# Patient Record
Sex: Male | Born: 1955 | Race: White | Hispanic: No | Marital: Married | Smoking: Never smoker
Health system: Southern US, Community
[De-identification: ages and names within clinical notes are randomized; demographics above are authoritative.]

## PROBLEM LIST (undated history)

## (undated) DIAGNOSIS — I10 Essential (primary) hypertension: Secondary | ICD-10-CM

## (undated) DIAGNOSIS — J309 Allergic rhinitis, unspecified: Secondary | ICD-10-CM

## (undated) DIAGNOSIS — E039 Hypothyroidism, unspecified: Secondary | ICD-10-CM

## (undated) DIAGNOSIS — G4730 Sleep apnea, unspecified: Secondary | ICD-10-CM

## (undated) DIAGNOSIS — K219 Gastro-esophageal reflux disease without esophagitis: Secondary | ICD-10-CM

## (undated) DIAGNOSIS — M353 Polymyalgia rheumatica: Secondary | ICD-10-CM

## (undated) DIAGNOSIS — M1990 Unspecified osteoarthritis, unspecified site: Secondary | ICD-10-CM

## (undated) DIAGNOSIS — K4090 Unilateral inguinal hernia, without obstruction or gangrene, not specified as recurrent: Secondary | ICD-10-CM

## (undated) HISTORY — PX: BACK SURGERY: SHX140

## (undated) HISTORY — DX: Essential (primary) hypertension: I10

## (undated) HISTORY — PX: SPINE SURGERY: SHX786

## (undated) HISTORY — DX: Allergic rhinitis, unspecified: J30.9

## (undated) HISTORY — DX: Unilateral inguinal hernia, without obstruction or gangrene, not specified as recurrent: K40.90

## (undated) HISTORY — PX: HERNIA REPAIR: SHX51

## (undated) HISTORY — DX: Gastro-esophageal reflux disease without esophagitis: K21.9

## (undated) HISTORY — DX: Sleep apnea, unspecified: G47.30

## (undated) HISTORY — DX: Hypothyroidism, unspecified: E03.9

## (undated) HISTORY — DX: Unspecified osteoarthritis, unspecified site: M19.90

## (undated) HISTORY — DX: Polymyalgia rheumatica: M35.3

---

## 2009-01-21 HISTORY — PX: HERNIA REPAIR: SHX51

## 2009-07-21 DIAGNOSIS — K4090 Unilateral inguinal hernia, without obstruction or gangrene, not specified as recurrent: Secondary | ICD-10-CM

## 2009-07-21 HISTORY — DX: Unilateral inguinal hernia, without obstruction or gangrene, not specified as recurrent: K40.90

## 2014-02-07 ENCOUNTER — Ambulatory Visit: Payer: Self-pay | Admitting: Family Medicine

## 2016-02-16 ENCOUNTER — Emergency Department
Admission: EM | Admit: 2016-02-16 | Discharge: 2016-02-16 | Disposition: A | Discharge status: Home / Self Care | Payer: Managed Care, Other (non HMO) | Attending: Emergency Medicine | Admitting: Emergency Medicine

## 2016-02-16 ENCOUNTER — Emergency Department: Payer: Managed Care, Other (non HMO)

## 2016-02-16 ENCOUNTER — Encounter: Payer: Self-pay | Admitting: Emergency Medicine

## 2016-02-16 DIAGNOSIS — M5416 Radiculopathy, lumbar region: Secondary | ICD-10-CM | POA: Diagnosis not present

## 2016-02-16 DIAGNOSIS — M545 Low back pain, unspecified: Secondary | ICD-10-CM

## 2016-02-16 MED ORDER — HYDROCODONE-ACETAMINOPHEN 5-325 MG PO TABS: 1.0000 | ORAL_TABLET | Freq: Three times a day (TID) | ORAL | 0 refills | Status: DC | PRN

## 2016-02-16 MED ORDER — ORPHENADRINE CITRATE 30 MG/ML IJ SOLN
60.0000 mg | INTRAMUSCULAR | Status: AC
  Administered 2016-02-16: 60 mg via INTRAMUSCULAR
  Filled 2016-02-16: qty 2

## 2016-02-16 MED ORDER — KETOROLAC TROMETHAMINE 10 MG PO TABS: 10.0000 mg | ORAL_TABLET | Freq: Three times a day (TID) | ORAL | 0 refills | Status: DC

## 2016-02-16 MED ORDER — PREDNISONE 20 MG PO TABS: 20.0000 mg | ORAL_TABLET | Freq: Two times a day (BID) | ORAL | 0 refills | Status: DC

## 2016-02-16 MED ORDER — PREDNISONE 20 MG PO TABS
60.0000 mg | ORAL_TABLET | Freq: Once | ORAL | Status: AC
  Administered 2016-02-16: 60 mg via ORAL
  Filled 2016-02-16: qty 3

## 2016-02-16 MED ORDER — DIAZEPAM 2 MG PO TABS: 2.0000 mg | ORAL_TABLET | Freq: Three times a day (TID) | ORAL | 0 refills | Status: DC | PRN

## 2016-02-16 MED ORDER — HYDROMORPHONE HCL 1 MG/ML IJ SOLN
1.0000 mg | Freq: Once | INTRAMUSCULAR | Status: AC
  Administered 2016-02-16: 1 mg via INTRAMUSCULAR
  Filled 2016-02-16: qty 1

## 2016-02-16 MED ORDER — KETOROLAC TROMETHAMINE 60 MG/2ML IM SOLN
60.0000 mg | Freq: Once | INTRAMUSCULAR | Status: AC
  Administered 2016-02-16: 60 mg via INTRAMUSCULAR
  Filled 2016-02-16: qty 2

## 2016-02-16 MED ORDER — CYCLOBENZAPRINE HCL 5 MG PO TABS: 5.0000 mg | ORAL_TABLET | Freq: Three times a day (TID) | ORAL | 0 refills | Status: DC | PRN

## 2016-02-16 NOTE — ED Notes (Signed)
Patient c/o lower right back pain, radiating down right leg X 2 weeks with increasing severity last night. Pt reports hx of lumbar back surgery in 1988. Patient reports he was shoveling snow on 1/17 (same day numbness/tingling down right leg began)

## 2016-02-16 NOTE — ED Notes (Signed)
Patient reports 800 mg Metaxalone at [facility]

## 2016-02-16 NOTE — ED Triage Notes (Addendum)
C/O pain to right hip, right low back, and numbness to right thigh.  Pain began after shoveling snow last week and worsened last night at work.

## 2016-02-16 NOTE — Discharge Instructions (Addendum)
Your exam and x-ray are consistent with a lumbar radiculopathy, or spinal nerve irritation. Take the prescription meds as directed. Take the Ketorolac, Valium, and Norco as directed. Start the steroid, after the Ketorolac is completed, if symptoms persist. Apply ice packs to reduce symptoms. Follow-up with Dr. Roland Rack and be sure to establish care with a local primary care provider. Return to the ED for increased/uncontrolled pain, leg weakness, or incontinence.

## 2016-02-16 NOTE — ED Provider Notes (Signed)
[facility] Emergency Department Provider Note ____________________________________________  Time seen: 7  I have reviewed the triage vital signs and the nursing notes.  HISTORY  Chief Complaint  Back Pain  HPI Nicholas Wall is a 61 y.o. male resents to the ED, accompanied by his wife for evaluation of pain to the right side low back, right hip and numbness to the anterior right thigh. Mode history of a right L4 laminectomy some 20 years ago. He denies any history of chronic ongoing persistent back pain. He does admit to recognizing from anterior leg paresthesias 2 weeks ago. He describes symptoms are probably worsened by his snow shoveling activities over the last 2 weeks. 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 He denies any injury, accident, fall. He is without any distal paresthesias, foot drop, or incontinence. Patient has taken ibuprofen and his wife's Skelaxin without significant benefit.  History reviewed. No pertinent past medical history.  There are no active problems to display for this patient.   Past Surgical History:  Procedure Laterality Date  . [place]-- L4-L5 laminectomy  . HERNIA REPAIR      Prior to Admission medications   Medication Sig Start Date End Date Taking? Authorizing Provider  cyclobenzaprine (FLEXERIL) 5 MG tablet Take 1 tablet (5 mg total) by mouth 3 (three) times daily as needed for muscle spasms. 02/16/16   Verdell Kincannon V Bacon Shabana Armentrout, PA-C  ketorolac (TORADOL) 10 MG tablet Take 1 tablet (10 mg total) by mouth every 8 (eight) hours. 02/16/16   Dannielle Karvonen Teran Knittle, PA-C   Allergies Patient has no known allergies.  No family history on file.  Social History Social History  Substance Use Topics  . Smoking status: Never Smoker  . Smokeless tobacco: Never Used  . Alcohol use Yes     Comment: daily    Review of Systems  Constitutional: Negative for fever. Cardiovascular:  Negative for chest pain. Respiratory: Negative for shortness of breath. Gastrointestinal: Negative for abdominal pain, vomiting and diarrhea. Genitourinary: Negative for dysuria. Musculoskeletal: Positive for back pain. Skin: Negative for rash. Neurological: Negative for headaches, focal weakness. Reports right anterior thigh numbness. ____________________________________________  PHYSICAL EXAM:  VITAL SIGNS: ED Triage Vitals  Enc Vitals Group     BP 02/16/16 1834 (!) 162/93     Pulse Rate 02/16/16 1834 65     Resp 02/16/16 1834 16     Temp 02/16/16 1834 98.1 F (36.7 C)     Temp src --      SpO2 02/16/16 1834 98 %     Weight 02/16/16 1832 258 lb (117 kg)     Height 02/16/16 1832 6\' 2"  (1.88 m)     Head Circumference --      Peak Flow --      Pain Score 02/16/16 1832 10     Pain Loc --      Pain Edu? --      Excl. in [place]? --     Constitutional: Alert and oriented. Well appearing and in no distress. Head: Normocephalic and atraumatic. Cardiovascular: Normal rate, regular rhythm. Normal distal pulses. Respiratory: Normal respiratory effort. No wheezes/rales/rhonchi. Gastrointestinal: Soft and nontender. No distention. Musculoskeletal: Normal spinal alignment without midline tenderness, spasm, deformity, or step-off. Patient is tender to palpation over the right SI joint region. He is able to transition from sit to stand but is unable to fully extend the lumbar spine. Able to dorsiflex the toes and  fever to the feet. Nontender with normal range of motion in all extremities.  Neurologic:  Antalgic for flexed gait without ataxia. Nerves II through XII grossly intact. Normal LE DTRs bilaterally. Normal speech and language. No gross focal neurologic deficits are appreciated. Skin:  Skin is warm, dry and intact. No rash noted. Psychiatric: Mood and affect are normal. Patient exhibits appropriate insight and judgment. ____________________________________________   RADIOLOGY  Lumbar  Spine   IMPRESSION: No acute findings.  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  I, Josephene Marrone, Dannielle Karvonen, personally viewed and evaluated these images (plain radiographs) as part of my medical decision making, as well as reviewing the written report by the radiologist. ____________________________________________  PROCEDURES  Toradol 60 mg IM Norflex 60 mg IM Dilaudid 1 mg IM Prednisone 60 mg PO ____________________________________________  INITIAL IMPRESSION / ASSESSMENT AND PLAN / ED COURSE  Patioent with a presentation consistent with lumbar radicular symptoms in the L3-4 distribution on the right. Reports pain improved to a 3/10 at the time of this disposition. He is reassured by his stable x-rays. He will follow-up with his provider or spine specialist as needed. Prescriptions for Prednisone, Valium, Toradol, Norco, and Flexeril are provided. Dosing instructions are provided both verbally, and written. Return precautions are reviewed.  ____________________________________________  FINAL CLINICAL IMPRESSION(S) / ED DIAGNOSES  Final diagnoses:  Acute right-sided low back pain without sciatica  Lumbar radiculopathy      Melvenia Needles, PA-C 02/16/16 2257    Melvenia Needles, PA-C 02/16/16 2258    Lavonia Drafts, MD 02/16/16 2311

## 2016-02-19 ENCOUNTER — Emergency Department
Admission: EM | Admit: 2016-02-19 | Discharge: 2016-02-19 | Disposition: A | Discharge status: Home / Self Care | Payer: Managed Care, Other (non HMO) | Attending: Emergency Medicine | Admitting: Emergency Medicine

## 2016-02-19 ENCOUNTER — Encounter: Payer: Self-pay | Admitting: Emergency Medicine

## 2016-02-19 DIAGNOSIS — M545 Low back pain: Secondary | ICD-10-CM | POA: Diagnosis present

## 2016-02-19 DIAGNOSIS — M5441 Lumbago with sciatica, right side: Secondary | ICD-10-CM | POA: Diagnosis not present

## 2016-02-19 MED ORDER — PREDNISONE 10 MG (21) PO TBPK: ORAL_TABLET | ORAL | 0 refills | Status: DC

## 2016-02-19 MED ORDER — OXYCODONE-ACETAMINOPHEN 5-325 MG PO TABS: 1.0000 | ORAL_TABLET | ORAL | 0 refills | Status: DC | PRN

## 2016-02-19 MED ORDER — DIAZEPAM 2 MG PO TABS: 2.0000 mg | ORAL_TABLET | Freq: Three times a day (TID) | ORAL | 0 refills | Status: DC | PRN

## 2016-02-19 MED ORDER — CYCLOBENZAPRINE HCL 5 MG PO TABS: ORAL_TABLET | ORAL | 0 refills | Status: DC

## 2016-02-19 MED ORDER — CYCLOBENZAPRINE HCL 5 MG PO TABS: 5.0000 mg | ORAL_TABLET | Freq: Three times a day (TID) | ORAL | 0 refills | Status: DC | PRN

## 2016-02-19 NOTE — Discharge Instructions (Signed)
Call and make an appointment with Dr. Sabra Wall for evaluation of Nicholas Wall back. Discontinue Norco (hydrocodone) for pain. Discontinue Toradol. Discontinue taking Flexeril (cyclobenzaprine) 3 times a day. Begin taking Flexeril 2 tablets at bedtime.  This may cause increased drowsiness so be aware of the increased risks of following if you get up during the night. Begin taking Sterapred DS pack as directed. Hold on to your prednisone 20 mg tablets until after he had seen Dr. Sabra Wall. Percocet 1 every 4 hours as needed for pain. Continue taking diazepam 2 mg 1 every 8 hours during the day but not to be taken with Flexeril. Keep your Appointment with Nicholas Wall in [place] and have Nicholas Wall blood pressure reevaluated. Ice or heat to your back as needed for comfort. Return to the emergency room immediately if any loss of bowel or bladder control.

## 2016-02-19 NOTE — ED Triage Notes (Addendum)
Was seen last week for lower back pain after shoveling snow  States meds are not any better  Ambulates with slight limp d/t pain  Has not started on prednisone yet

## 2016-02-19 NOTE — ED Provider Notes (Signed)
[facility] Emergency Department Provider Note   ____________________________________________   First MD Initiated Contact with Patient 02/19/16 1440     (approximate)  I have reviewed the triage vital signs and the nursing notes.   HISTORY  Chief Complaint Back Pain    HPI Nicholas Wall is a 61 y.o. male is here with continued low back pain. Patient was seen in the emergency room last week with low back pain after shoveling snow. He states that even with the medication he is not any better. He is also confused at the instructions for his medication and is unclear how he should be taking them. He continues to have low back pain with radiation into his right leg. At this time he has not started taking the prednisone because of the confusion on his instructions. Patient denies any urinary symptoms. He also denies any incontinence of bowel or bladder. Patient has the bottles of the prescriptions that he was prescribed on his last visit. His current bottle was renewed Flexeril 5 mg 3 times a day, diazepam 2 mg 3 times a day, Norco one tablet every 8 hours for pain, Toradol 10 mg 3 times a day. Patient has prednisone 20 mg one twice a day for 5 days but has not started it at this time. Currently patient rates his pain as a 7/10. Patient was able to drive to the emergency room and was ambulatory without assistance. He also states that in the past he has been on blood pressure medication but his PCP prior to moving to [place] discontinued his medication as he lost weight. Patient also had surgery on his back in the past prior to moving to this state. Patient also gives a history of work table being much lower for other coworkers to be able to work with. He spends much of his time bending over this table. This is increasing his back pain. Patient states that until shoveling snow he did not have everyday chronic back pain.   History reviewed. No pertinent past medical  history.  There are no active problems to display for this patient.   Past Surgical History:  Procedure Laterality Date  . [place]-- L4-L5 laminectomy  . HERNIA REPAIR      Prior to Admission medications   Medication Sig Start Date End Date Taking? Authorizing Provider  cyclobenzaprine (FLEXERIL) 5 MG tablet Take 2 tablets at hs 02/19/16   Johnn Hai, PA-C  diazepam (VALIUM) 2 MG tablet Take 1 tablet (2 mg total) by mouth every 8 (eight) hours as needed for muscle spasms. 02/19/16   Johnn Hai, PA-C  oxyCODONE-acetaminophen (PERCOCET) 5-325 MG tablet Take 1 tablet by mouth every 4 (four) hours as needed for moderate pain or severe pain. 02/19/16   Johnn Hai, PA-C  predniSONE (STERAPRED UNI-PAK 21 TAB) 10 MG (21) TBPK tablet As directed 02/19/16   Johnn Hai, PA-C    Allergies Patient has no known allergies.  No family history on file.  Social History Social History  Substance Use Topics  . Smoking status: Never Smoker  . Smokeless tobacco: Never Used  . Alcohol use Yes     Comment: daily    Review of Systems Constitutional: No fever/chills Cardiovascular: Denies chest pain. Respiratory: Denies shortness of breath. Gastrointestinal: No abdominal pain.  No nausea, no vomiting.  Genitourinary: Negative for dysuria. Musculoskeletal: Positive for low back pain. Positive for right leg sciatica. Skin: Negative for rash.  Neurological: Negative for headaches, focal weakness. Positive right leg paresthesia.  10-point ROS otherwise negative.  ____________________________________________   PHYSICAL EXAM:  VITAL SIGNS: ED Triage Vitals  Enc Vitals Group     BP 02/19/16 1434 (!) 168/101     Pulse Rate 02/19/16 1434 70     Resp 02/19/16 1434 18     Temp 02/19/16 1434 98.1 F (36.7 C)     Temp Source 02/19/16 1434 Oral     SpO2 02/19/16 1434 98 %     Weight 02/19/16 1434 258 lb (117 kg)     Height 02/19/16 1434 6\' 2"  (1.88 m)     Head  Circumference --      Peak Flow --      Pain Score 02/19/16 1438 7     Pain Loc --      Pain Edu? --      Excl. in [place]? --     Constitutional: Alert and oriented. Well appearing and in no acute distress. Eyes: Conjunctivae are normal. PERRL. EOMI. Head: Atraumatic. Nose: No congestion/rhinnorhea. Neck: No stridor.   Cardiovascular: Normal rate, regular rhythm. Grossly normal heart sounds.  Good peripheral circulation. Respiratory: Normal respiratory effort.  No retractions. Lungs CTAB. Musculoskeletal: On examination of low back there is a well-healed surgical scar at the waistline. There is tenderness to palpation in this area L5-S1 as well as right paravertebral muscles. Range of motion is guarded secondary to pain. Reflexes were 1+ bilaterally. Straight leg raises were approximately 30 bilateral legs with increased pain in his back. Neurologic:  Normal speech and language. No gross focal neurologic deficits are appreciated.  Skin:  Skin is warm, dry and intact. No rash noted. Psychiatric: Mood and affect are normal. Speech and behavior are normal.  ____________________________________________   LABS (all labs ordered are listed, but only abnormal results are displayed)  Labs Reviewed - No data to display  RADIOLOGY  Lumbar spine x-ray on 02/16/16 was reviewed. ____________________________________________   PROCEDURES  Procedure(s) performed: None  Procedures  Critical Care performed: No  ____________________________________________   INITIAL IMPRESSION / ASSESSMENT AND PLAN / ED COURSE  Pertinent labs & imaging results that were available during my care of the patient were reviewed by me and considered in my medical decision making (see chart for details).  Blood pressure was elevated at the time of patient's visit. It is unclear whether his blood pressure is elevated because the pain or for hypertension. He has made an appointment with Dr. Janae Bridgeman and will have  this reevaluated at that time. Patient is to call tomorrow for follow-up visit with Dr. Sabra Heck. At this time we are changing his medication therapy. He will no longer take Flexeril 3 times a day and diazepam 2 mg 3 times a day. He will also discontinue taking Norco for pain and Toradol. Patient's instructions were to begin Flexeril 2 tablets at bedtime. Diazepam 2 mg 1 tablet 3 times a day. He is to discontinue taking Norco and begin taking Percocet 1 every 4 hours as needed for moderate to severe pain. He is to begin taking Sterapred DS 60 mg 6 day taper. Patient was taken out of work for the next 2 days and then to return with light duty of lifting no more than 25 pounds. He is instructed and understands to return to the emergency room immediately if any incontinence of bowel or bladder.      ____________________________________________   FINAL CLINICAL IMPRESSION(S) / ED DIAGNOSES  Final diagnoses:  Acute right-sided low back pain with right-sided sciatica      NEW MEDICATIONS STARTED DURING THIS VISIT:  Discharge Medication List as of 02/19/2016  4:04 PM       Note:  This document was prepared using Dragon voice recognition software and may include unintentional dictation errors.    Johnn Hai, PA-C 02/19/16 1633    Lavonia Drafts, MD 02/20/16 [phone]

## 2016-03-08 ENCOUNTER — Ambulatory Visit (INDEPENDENT_AMBULATORY_CARE_PROVIDER_SITE_OTHER): Payer: Managed Care, Other (non HMO) | Admitting: Family Medicine

## 2016-03-08 ENCOUNTER — Encounter: Payer: Self-pay | Admitting: Family Medicine

## 2016-03-08 VITALS — BP 128/89 | HR 97 | Temp 97.7°F | Resp 17 | Ht 73.0 in | Wt 262.0 lb

## 2016-03-08 DIAGNOSIS — M47816 Spondylosis without myelopathy or radiculopathy, lumbar region: Secondary | ICD-10-CM | POA: Insufficient documentation

## 2016-03-08 DIAGNOSIS — M4726 Other spondylosis with radiculopathy, lumbar region: Secondary | ICD-10-CM | POA: Diagnosis not present

## 2016-03-08 DIAGNOSIS — R1310 Dysphagia, unspecified: Secondary | ICD-10-CM

## 2016-03-08 DIAGNOSIS — Z125 Encounter for screening for malignant neoplasm of prostate: Secondary | ICD-10-CM

## 2016-03-08 DIAGNOSIS — M5441 Lumbago with sciatica, right side: Secondary | ICD-10-CM

## 2016-03-08 DIAGNOSIS — Z1211 Encounter for screening for malignant neoplasm of colon: Secondary | ICD-10-CM

## 2016-03-08 DIAGNOSIS — K219 Gastro-esophageal reflux disease without esophagitis: Secondary | ICD-10-CM

## 2016-03-08 DIAGNOSIS — Z23 Encounter for immunization: Secondary | ICD-10-CM

## 2016-03-08 DIAGNOSIS — Z8639 Personal history of other endocrine, nutritional and metabolic disease: Secondary | ICD-10-CM

## 2016-03-08 DIAGNOSIS — Z1159 Encounter for screening for other viral diseases: Secondary | ICD-10-CM

## 2016-03-08 DIAGNOSIS — Z13220 Encounter for screening for lipoid disorders: Secondary | ICD-10-CM

## 2016-03-08 DIAGNOSIS — E039 Hypothyroidism, unspecified: Secondary | ICD-10-CM | POA: Insufficient documentation

## 2016-03-08 LAB — UA/M W/RFLX CULTURE, ROUTINE
Bilirubin, UA: NEGATIVE
Glucose, UA: NEGATIVE
Ketones, UA: NEGATIVE
Leukocytes, UA: NEGATIVE
Nitrite, UA: NEGATIVE
PH UA: 5.5 (ref 5.0–7.5)
RBC, UA: NEGATIVE
Specific Gravity, UA: >1.03 — ABNORMAL HIGH (ref 1.005–1.030)
UUROB: 0.2 mg/dL (ref 0.2–1.0)

## 2016-03-08 LAB — MICROSCOPIC EXAMINATION
Epithelial Cells (non renal): NONE SEEN /hpf (ref 0–10)
RBC MICROSCOPIC, UA: NONE SEEN /HPF (ref 0–?)

## 2016-03-08 MED ORDER — OMEPRAZOLE 20 MG PO CPDR: 20.0000 mg | DELAYED_RELEASE_CAPSULE | Freq: Every day | ORAL | 1 refills | Status: DC

## 2016-03-08 MED ORDER — IBUPROFEN 800 MG PO TABS
800.0000 mg | ORAL_TABLET | Freq: Three times a day (TID) | ORAL | 1 refills | Status: DC | PRN
Start: 2016-03-08 — End: 2020-11-24

## 2016-03-08 NOTE — Assessment & Plan Note (Signed)
Will restart his omeprazole, does not seem to be well controlled. To see GI for dysphagia and screening colonoscopy

## 2016-03-08 NOTE — Patient Instructions (Addendum)
Sciatica Rehab Ask your health care provider which exercises are safe for you. Do exercises exactly as told by your health care provider and adjust them as directed. It is normal to feel mild stretching, pulling, tightness, or discomfort as you do these exercises, but you should stop right away if you feel sudden pain or your pain gets worse.Do not begin these exercises until told by your health care provider. Stretching and range of motion exercises These exercises warm up your muscles and joints and improve the movement and flexibility of your hips and your back. These exercises also help to relieve pain, numbness, and tingling. Exercise A: Sciatic nerve glide 1. Sit in a chair with your head facing down toward your chest. Place your hands behind your back. Let your shoulders slump forward. 2. Slowly straighten one of your knees while you tilt your head back as if you are looking toward the ceiling. Only straighten your leg as far as you can without making your symptoms worse. 3. Hold for __________ seconds. 4. Slowly return to the starting position. 5. Repeat with your other leg. Repeat __________ times. Complete this exercise __________ times a day. Exercise B: Knee to chest with hip adduction and internal rotation 1. Lie on your back on a firm surface with both legs straight. 2. Bend one of your knees and move it up toward your chest until you feel a gentle stretch in your lower back and buttock. Then, move your knee toward the shoulder that is on the opposite side from your leg.  Hold your leg in this position by holding onto the front of your knee. 3. Hold for __________ seconds. 4. Slowly return to the starting position. 5. Repeat with your other leg. Repeat __________ times. Complete this exercise __________ times a day. Exercise C: Prone extension on elbows 1. Lie on your abdomen on a firm surface. A bed may be too soft for this exercise. 2. Prop yourself up on your elbows. 3. Use  your arms to help lift your chest up until you feel a gentle stretch in your abdomen and your lower back.  This will place some of your body weight on your elbows. If this is uncomfortable, try stacking pillows under your chest.  Your hips should stay down, against the surface that you are lying on. Keep your hip and back muscles relaxed. 4. Hold for __________ seconds. 5. Slowly relax your upper body and return to the starting position. Repeat __________ times. Complete this exercise __________ times a day. Strengthening exercises These exercises build strength and endurance in your back. Endurance is the ability to use your muscles for a long time, even after they get tired. Exercise D: Pelvic tilt 1. Lie on your back on a firm surface. Bend your knees and keep your feet flat. 2. Tense your abdominal muscles. Tip your pelvis up toward the ceiling and flatten your lower back into the floor.  To help with this exercise, you may place a small towel under your lower back and try to push your back into the towel. 3. Hold for __________ seconds. 4. Let your muscles relax completely before you repeat this exercise. Repeat __________ times. Complete this exercise __________ times a day. Exercise E: Alternating arm and leg raises 1. Get on your hands and knees on a firm surface. If you are on a hard floor, you may want to use padding to cushion your knees, such as an exercise mat. 2. Line up your arms and legs. Your  hands should be below your shoulders, and your knees should be below your hips. 3. Lift your left leg behind you. At the same time, raise your right arm and straighten it in front of you.  Do not lift your leg higher than your hip.  Do not lift your arm higher than your shoulder.  Keep your abdominal and back muscles tight.  Keep your hips facing the ground.  Do not arch your back.  Keep your balance carefully, and do not hold your breath. 4. Hold for __________  seconds. 5. Slowly return to the starting position and repeat with your right leg and your left arm. Repeat __________ times. Complete this exercise __________ times a day. Posture and body mechanics   Body mechanics refers to the movements and positions of your body while you do your daily activities. Posture is part of body mechanics. Good posture and healthy body mechanics can help to relieve stress in your body's tissues and joints. Good posture means that your spine is in its natural S-curve position (your spine is neutral), your shoulders are pulled back slightly, and your head is not tipped forward. The following are general guidelines for applying improved posture and body mechanics to your everyday activities. Standing   When standing, keep your spine neutral and your feet about hip-width apart. Keep a slight bend in your knees. Your ears, shoulders, and hips should line up.  When you do a task in which you stand in one place for a long time, place one foot up on a stable object that is 2-4 inches (5-10 cm) high, such as a footstool. This helps keep your spine neutral. Sitting  When sitting, keep your spine neutral and keep your feet flat on the floor. Use a footrest, if necessary, and keep your thighs parallel to the floor. Avoid rounding your shoulders, and avoid tilting your head forward.  When working at a desk or a computer, keep your desk at a height where your hands are slightly lower than your elbows. Slide your chair under your desk so you are close enough to maintain good posture.  When working at a computer, place your monitor at a height where you are looking straight ahead and you do not have to tilt your head forward or downward to look at the screen. Resting   When lying down and resting, avoid positions that are most painful for you.  If you have pain with activities such as sitting, bending, stooping, or squatting (flexion-based activities), lie in a position in  which your body does not bend very much. For example, avoid curling up on your side with your arms and knees near your chest (fetal position).  If you have pain with activities such as standing for a long time or reaching with your arms (extension-based activities), lie with your spine in a neutral position and bend your knees slightly. Try the following positions:  Lying on your side with a pillow between your knees.  Lying on your back with a pillow under your knees. Lifting   When lifting objects, keep your feet at least shoulder-width apart and tighten your abdominal muscles.  Bend your knees and hips and keep your spine neutral. It is important to lift using the strength of your legs, not your back. Do not lock your knees straight out.  Always ask for help to lift heavy or awkward objects. This information is not intended to replace advice given to you by your health care provider. Make sure  or awkward objects. This information is not intended to replace advice given to you by your health care provider. Make sure you discuss any questions you have with your health care provider. Document Released: 01/07/2005 Document Revised: 09/14/2015 Document Reviewed: 09/23/2014 [employer] Interactive Patient Education  2017 [employer]          

## 2016-03-08 NOTE — Progress Notes (Signed)
BP 128/89 (BP Location: Left Arm, Patient Position: Sitting, Cuff Size: Large)   Pulse 97   Temp 97.7 F (36.5 C) (Oral)   Resp 17   Ht 6\' 1"  (1.854 m)   Wt 262 lb (118.8 kg)   SpO2 97%   BMI 34.57 kg/m    Subjective:    Patient ID: Nicholas Wall, male    DOB: 02-Feb-1955, 61 y.o.   MRN: [id number]  HPI: Nicholas Wall is a 61 y.o. male  Chief Complaint  Patient presents with  . Establish Care   Hasn't seen a doctor in 7-8 years. Has a history of hypothyroid, not on any medicine.   BACK PAIN Duration: couple of months Mechanism of injury: no trauma, worsened by shoveling the snow Location: Right and low back and into his R hip Onset: gradual Severity: 2/10 Quality: aching and burning Frequency: intermittent Radiation: R leg above the knee Aggravating factors: bending over Alleviating factors: leaning and flexing to the R, ibuprofen, flexeril Status: better Treatments attempted: flexeril, chiropractry, and ibuprofen  Relief with NSAIDs?: significant Nighttime pain:  no Paresthesias / decreased sensation:  yes Bowel / bladder incontinence:  no Fevers:  no Dysuria / urinary frequency:  no  DYSPHAGIA Duration: chronic Description of symptom: last bite of food gets stuck Onset: Several seconds after swallowing Location of dysphagia: throat Dysphagia to solids only: yes Dysphagia to solids & liquids: no  Frequency:intermittent  Progressively getting worse: yes Alleviatiating factors: drinking something Provoking factors: certain foods Status: worse EGD: no Weight loss: no Sensation of lump in throat: no Heartburn: yes Odynophagia: no Nausea: no Vomiting: no Drooling/nasal regurgitation/food spillage: no Coughing/choking/dysphonia: no Dysarthria: no Hematemesis: no Regurgitation of undigested food/halitosis: no Chest pain: no  Active Ambulatory Problems    Diagnosis Date Noted  . History of hypothyroidism 03/08/2016  . GERD (gastroesophageal reflux  disease) 03/08/2016  . DJD (degenerative joint disease), lumbar 03/08/2016   Resolved Ambulatory Problems    Diagnosis Date Noted  . No Resolved Ambulatory Problems   No Additional Past Medical History   Past Surgical History:  Procedure Laterality Date  . [place]-- L4-L5 laminectomy  . HERNIA REPAIR     Outpatient Encounter Prescriptions as of 03/08/2016  Medication Sig  . cyclobenzaprine (FLEXERIL) 5 MG tablet Take 2 tablets at hs  . ibuprofen (ADVIL,MOTRIN) 800 MG tablet Take 1 tablet (800 mg total) by mouth every 8 (eight) hours as needed.  . [DISCONTINUED] ibuprofen (ADVIL,MOTRIN) 800 MG tablet Take 800 mg by mouth every 6 (six) hours as needed.  Marland Kitchen omeprazole (PRILOSEC) 20 MG capsule Take 1 capsule (20 mg total) by mouth daily.  . [DISCONTINUED] diazepam (VALIUM) 2 MG tablet Take 1 tablet (2 mg total) by mouth every 8 (eight) hours as needed for muscle spasms.  . [DISCONTINUED] oxyCODONE-acetaminophen (PERCOCET) 5-325 MG tablet Take 1 tablet by mouth every 4 (four) hours as needed for moderate pain or severe pain.  . [DISCONTINUED] predniSONE (STERAPRED UNI-PAK 21 TAB) 10 MG (21) TBPK tablet As directed   No facility-administered encounter medications on file as of 03/08/2016.    No Known Allergies Social History   Social History  . Marital status: Married    Spouse name: N/A  . Number of children: N/A  . Years of education: N/A   Occupational History  . Not on file.   Social History Main Topics  . Smoking status: Never Smoker  . Smokeless tobacco: Never Used  . Alcohol use Yes  Comment: daily  . Drug use: No  . Sexual activity: Not on file   Other Topics Concern  . Not on file   Social History Narrative  . No narrative on file   Family History  Problem Relation Age of Onset  . Hypertension Mother   . Hyperlipidemia Mother   . Diabetes Mother   . Arthritis Mother   . Vision loss Mother   . Cancer Father   . Hyperlipidemia Father   .  Arthritis Father   . Heart disease Father   . Hyperlipidemia Maternal Grandmother   . Hypertension Maternal Grandmother   . Alzheimer's disease Maternal Grandmother   . Heart disease Maternal Grandfather   . Hyperlipidemia Maternal Grandfather   . Hypertension Maternal Grandfather   . Cancer Paternal Grandmother     Review of Systems  Constitutional: Negative.   Respiratory: Negative.   Cardiovascular: Negative.   Gastrointestinal:       Heartburn  Musculoskeletal: Positive for back pain and myalgias. Negative for arthralgias, gait problem, joint swelling, neck pain and neck stiffness.  Neurological: Positive for numbness. Negative for dizziness, tremors, seizures, syncope, facial asymmetry, speech difficulty, weakness, light-headedness and headaches.  Psychiatric/Behavioral: Negative.     Per HPI unless specifically indicated above     Objective:    BP 128/89 (BP Location: Left Arm, Patient Position: Sitting, Cuff Size: Large)   Pulse 97   Temp 97.7 F (36.5 C) (Oral)   Resp 17   Ht 6\' 1"  (1.854 m)   Wt 262 lb (118.8 kg)   SpO2 97%   BMI 34.57 kg/m   Wt Readings from Last 3 Encounters:  03/08/16 262 lb (118.8 kg)  02/19/16 258 lb (117 kg)  02/16/16 258 lb (117 kg)    Physical Exam  Constitutional: He is oriented to person, place, and time. He appears well-developed and well-nourished. No distress.  HENT:  Head: Normocephalic and atraumatic.  Right Ear: Hearing normal.  Left Ear: Hearing normal.  Nose: Nose normal.  Eyes: Conjunctivae and lids are normal. Right eye exhibits no discharge. Left eye exhibits no discharge. No scleral icterus.  Cardiovascular: Normal rate, regular rhythm and intact distal pulses.  Exam reveals no gallop and no friction rub.   No murmur heard. Pulmonary/Chest: Effort normal. No respiratory distress. He has no wheezes. He has no rales. He exhibits no tenderness.  Musculoskeletal: Normal range of motion.  Neurological: He is alert and  oriented to person, place, and time.  Skin: Skin is warm, dry and intact. No rash noted. No erythema. No pallor.  Psychiatric: He has a normal mood and affect. His speech is normal and behavior is normal. Judgment and thought content normal. Cognition and memory are normal.  Nursing note and vitals reviewed. Back Exam:    Inspection:  Normal spinal curvature.  No deformity, ecchymosis, erythema, or lesions     Palpation:     Midline spinal tenderness: no      Paralumbar tenderness: yes Right     Parathoracic tenderness: no      Buttocks tenderness: yesRight     Range of Motion:      Flexion: Fingers to Knees     Extension:Decreased     Lateral bending:Decreased    Rotation:Decreased    Neuro Exam:Lower extremity DTRs normal & symmetric.  Strength and sensation intact except L2-3 on the R to sensation    Special Tests:      Straight leg raise:negative  No results found for this or  any previous visit.    Assessment & Plan:   Problem List Items Addressed This Visit      Digestive   GERD (gastroesophageal reflux disease)    Will restart his omeprazole, does not seem to be well controlled. To see GI for dysphagia and screening colonoscopy      Relevant Medications   omeprazole (PRILOSEC) 20 MG capsule   Other Relevant Orders   Ambulatory referral to Gastroenterology     Musculoskeletal and Integument   DJD (degenerative joint disease), lumbar    Offered PT, decreased sensation, but otherwise normal neuro exam. Continue to monitor closely.      Relevant Medications   ibuprofen (ADVIL,MOTRIN) 800 MG tablet     Other   History of hypothyroidism    Will check labs today and treat as needed.       Relevant Orders   Comprehensive metabolic panel   TSH    Other Visit Diagnoses    Acute right-sided low back pain with right-sided sciatica    -  Primary   Improving with chiropractry. Offered OMM and PT, he will consider. Continue current regimen. Call with any concerns.     Relevant Medications   ibuprofen (ADVIL,MOTRIN) 800 MG tablet   Other Relevant Orders   CBC with Differential/Platelet   Comprehensive metabolic panel   UA/M w/rflx Culture, Routine   Screening for cholesterol level       Labs drawn today. Await results.    Relevant Orders   Lipid Panel w/o Chol/HDL Ratio   Screening for prostate cancer       Labs drawn today. Await results.    Relevant Orders   PSA   Need for hepatitis C screening test       Labs drawn today. Await results.    Relevant Orders   Hepatitis C Antibody   Immunization due       Tdap given today.   Relevant Orders   Tdap vaccine greater than or equal to 7yo IM (Completed)   Dysphagia, unspecified type       Will get him into GI- referral generated today.   Relevant Orders   Ambulatory referral to Gastroenterology   Screening for colon cancer       Referral to GI made today.   Relevant Orders   Ambulatory referral to Gastroenterology       Follow up plan: Return Physical or OMM (not both same day), for Records Release Please.

## 2016-03-08 NOTE — Assessment & Plan Note (Signed)
Will check labs today and treat as needed.

## 2016-03-08 NOTE — Assessment & Plan Note (Signed)
Offered PT, decreased sensation, but otherwise normal neuro exam. Continue to monitor closely.

## 2016-03-09 LAB — COMPREHENSIVE METABOLIC PANEL
A/G RATIO: 1.9 (ref 1.2–2.2)
ALK PHOS: 67 IU/L (ref 39–117)
ALT: 66 IU/L — ABNORMAL HIGH (ref 0–44)
AST: 33 IU/L (ref 0–40)
Albumin: 4.3 g/dL (ref 3.5–5.5)
BUN/Creatinine Ratio: 18 (ref 9–20)
BUN: 18 mg/dL (ref 6–24)
Bilirubin Total: 0.8 mg/dL (ref 0.0–1.2)
CO2: 26 mmol/L (ref 18–29)
Calcium: 9.3 mg/dL (ref 8.7–10.2)
Chloride: 103 mmol/L (ref 96–106)
Creatinine, Ser: 1.02 mg/dL (ref 0.76–1.27)
GFR calc Af Amer: 93 mL/min/{1.73_m2} (ref >59)
GFR calc non Af Amer: 81 mL/min/{1.73_m2} (ref >59)
GLOBULIN, TOTAL: 2.3 g/dL (ref 1.5–4.5)
Glucose: 84 mg/dL (ref 65–99)
POTASSIUM: 4.6 mmol/L (ref 3.5–5.2)
Sodium: 144 mmol/L (ref 134–144)
Total Protein: 6.6 g/dL (ref 6.0–8.5)

## 2016-03-09 LAB — CBC WITH DIFFERENTIAL/PLATELET
BASOS: 0 %
Basophils Absolute: 0 10*3/uL (ref 0.0–0.2)
EOS (ABSOLUTE): 0.2 10*3/uL (ref 0.0–0.4)
EOS: 2 %
HEMATOCRIT: 41.8 % (ref 37.5–51.0)
Hemoglobin: 14.3 g/dL (ref 13.0–17.7)
Immature Grans (Abs): 0 10*3/uL (ref 0.0–0.1)
Immature Granulocytes: 0 %
LYMPHS ABS: 3.4 10*3/uL — AB (ref 0.7–3.1)
Lymphs: 34 %
MCH: 31.8 pg (ref 26.6–33.0)
MCHC: 34.2 g/dL (ref 31.5–35.7)
MCV: 93 fL (ref 79–97)
MONOS ABS: 1 10*3/uL — AB (ref 0.1–0.9)
Monocytes: 10 %
Neutrophils Absolute: 5.3 10*3/uL (ref 1.4–7.0)
Neutrophils: 54 %
Platelets: 220 10*3/uL (ref 150–379)
RBC: 4.49 x10E6/uL (ref 4.14–5.80)
RDW: 13.7 % (ref 12.3–15.4)
WBC: 10 10*3/uL (ref 3.4–10.8)

## 2016-03-09 LAB — LIPID PANEL W/O CHOL/HDL RATIO
CHOLESTEROL TOTAL: 178 mg/dL (ref 100–199)
HDL: 61 mg/dL (ref >39)
LDL Calculated: 94 mg/dL (ref 0–99)
TRIGLYCERIDES: 116 mg/dL (ref 0–149)
VLDL Cholesterol Cal: 23 mg/dL (ref 5–40)

## 2016-03-09 LAB — TSH: TSH: 8.37 u[IU]/mL — ABNORMAL HIGH (ref 0.450–4.500)

## 2016-03-09 LAB — HEPATITIS C ANTIBODY: Hep C Virus Ab: <0.1 s/co ratio (ref 0.0–0.9)

## 2016-03-09 LAB — PSA: Prostate Specific Ag, Serum: 1.2 ng/mL (ref 0.0–4.0)

## 2016-03-12 ENCOUNTER — Ambulatory Visit: Payer: Managed Care, Other (non HMO) | Admitting: Family Medicine

## 2016-03-27 ENCOUNTER — Ambulatory Visit (INDEPENDENT_AMBULATORY_CARE_PROVIDER_SITE_OTHER): Payer: Managed Care, Other (non HMO) | Admitting: Family Medicine

## 2016-03-27 ENCOUNTER — Encounter: Payer: Self-pay | Admitting: Family Medicine

## 2016-03-27 VITALS — BP 138/91 | HR 87 | Temp 98.1°F | Resp 17 | Ht 73.0 in | Wt 266.0 lb

## 2016-03-27 DIAGNOSIS — M9904 Segmental and somatic dysfunction of sacral region: Secondary | ICD-10-CM | POA: Diagnosis not present

## 2016-03-27 DIAGNOSIS — M9909 Segmental and somatic dysfunction of abdomen and other regions: Secondary | ICD-10-CM | POA: Diagnosis not present

## 2016-03-27 DIAGNOSIS — M9903 Segmental and somatic dysfunction of lumbar region: Secondary | ICD-10-CM

## 2016-03-27 DIAGNOSIS — M9902 Segmental and somatic dysfunction of thoracic region: Secondary | ICD-10-CM | POA: Diagnosis not present

## 2016-03-27 DIAGNOSIS — M5441 Lumbago with sciatica, right side: Secondary | ICD-10-CM

## 2016-03-27 DIAGNOSIS — M9908 Segmental and somatic dysfunction of rib cage: Secondary | ICD-10-CM | POA: Diagnosis not present

## 2016-03-27 DIAGNOSIS — M9905 Segmental and somatic dysfunction of pelvic region: Secondary | ICD-10-CM

## 2016-03-27 NOTE — Progress Notes (Signed)
BP (!) 138/91 (BP Location: Left Arm, Patient Position: Sitting, Cuff Size: Large)   Pulse 87   Temp 98.1 F (36.7 C) (Oral)   Resp 17   Ht 6\' 1"  (1.854 m)   Wt 266 lb (120.7 kg)   SpO2 97%   BMI 35.09 kg/m    Subjective:    Patient ID: Nicholas Wall, male    DOB: 1955/12/25, 61 y.o.   MRN: [id number]  HPI: Nicholas Wall is a 61 y.o. male  Chief Complaint  Patient presents with  . Back Pain   BACK PAIN- better with chiropractry, feeling better. Still with pain in his low back and some spasms in his mid back. Numbness and tingling has improved. He has been doing well with chiropractry, but would like to try OMT to see if it will help. Otherwise doing well with no other concerns or complaints at this time.  Duration: couple of months Mechanism of injury: no trauma, worsened by shoveling the snow Location: Right and low back and into his R hip Onset: gradual Severity: 2/10 Quality: aching and burning Frequency: intermittent Radiation: R leg above the knee Aggravating factors: bending over Alleviating factors: leaning and flexing to the R, ibuprofen, flexeril, chiropractry Status: better Treatments attempted: flexeril, chiropractry, and ibuprofen  Relief with NSAIDs?: significant Nighttime pain:  no Paresthesias / decreased sensation:  yes- improved Bowel / bladder incontinence:  no Fevers:  no Dysuria / urinary frequency:  no   Relevant past medical, surgical, family and social history reviewed and updated as indicated. Interim medical history since our last visit reviewed. Allergies and medications reviewed and updated.  Review of Systems  Constitutional: Negative.   Respiratory: Negative.   Cardiovascular: Negative.   Musculoskeletal: Positive for back pain and myalgias. Negative for arthralgias, gait problem, joint swelling, neck pain and neck stiffness.  Neurological: Positive for numbness.  Psychiatric/Behavioral: Negative.     Per HPI unless specifically  indicated above     Objective:    BP (!) 138/91 (BP Location: Left Arm, Patient Position: Sitting, Cuff Size: Large)   Pulse 87   Temp 98.1 F (36.7 C) (Oral)   Resp 17   Ht 6\' 1"  (1.854 m)   Wt 266 lb (120.7 kg)   SpO2 97%   BMI 35.09 kg/m   Wt Readings from Last 3 Encounters:  03/27/16 266 lb (120.7 kg)  03/08/16 262 lb (118.8 kg)  02/19/16 258 lb (117 kg)    Physical Exam  Constitutional: He is oriented to person, place, and time. He appears well-developed and well-nourished. No distress.  HENT:  Head: Normocephalic and atraumatic.  Right Ear: Hearing normal.  Left Ear: Hearing normal.  Nose: Nose normal.  Eyes: Conjunctivae and lids are normal. Right eye exhibits no discharge. Left eye exhibits no discharge. No scleral icterus.  Pulmonary/Chest: Effort normal. No respiratory distress.  Abdominal: Soft. He exhibits no distension and no mass. There is no tenderness. There is no rebound and no guarding.  Neurological: He is alert and oriented to person, place, and time.  Skin: Skin is warm, dry and intact. No rash noted. He is not diaphoretic. No erythema. No pallor.  Psychiatric: He has a normal mood and affect. His speech is normal and behavior is normal. Judgment and thought content normal. Cognition and memory are normal.  Nursing note and vitals reviewed. Musculoskeletal:  Exam found Decreased ROM, Tissue texture changes, Tenderness to palpation and Asymmetry of patient's  thorax, ribs, lumbar, pelvis, sacrum and abdomen Osteopathic  Structural Exam:   Thorax: Periscapular hypertonicity bilaterally  Ribs: Rib 6 locked up on the R  Lumbar: QL hypertonic on the R, [id number]  Pelvis: Anterior R pelvis  Sacrum: L on L sacral torsion  Abdomen: pelvic diaphragm pulled to the R   Results for orders placed or performed in visit on 03/08/16  Microscopic Examination  Result Value Ref Range   WBC, UA 0-5 0 - 5 /hpf   RBC, UA None seen 0 - 2 /hpf   Epithelial Cells (non renal)  None seen 0 - 10 /hpf   Mucus, UA Present (A) Not Estab.   Bacteria, UA Few (A) None seen/Few  CBC with Differential/Platelet  Result Value Ref Range   WBC 10.0 3.4 - 10.8 x10E3/uL   RBC 4.49 4.14 - 5.80 x10E6/uL   Hemoglobin 14.3 13.0 - 17.7 g/dL   Hematocrit 41.8 37.5 - 51.0 %   MCV 93 79 - 97 fL   MCH 31.8 26.6 - 33.0 pg   MCHC 34.2 31.5 - 35.7 g/dL   RDW 13.7 12.3 - 15.4 %   Platelets 220 150 - 379 x10E3/uL   Neutrophils 54 Not Estab. %   Lymphs 34 Not Estab. %   Monocytes 10 Not Estab. %   Eos 2 Not Estab. %   Basos 0 Not Estab. %   Neutrophils Absolute 5.3 1.4 - 7.0 x10E3/uL   Lymphocytes Absolute 3.4 (H) 0.7 - 3.1 x10E3/uL   Monocytes Absolute 1.0 (H) 0.1 - 0.9 x10E3/uL   EOS (ABSOLUTE) 0.2 0.0 - 0.4 x10E3/uL   Basophils Absolute 0.0 0.0 - 0.2 x10E3/uL   Immature Granulocytes 0 Not Estab. %   Immature Grans (Abs) 0.0 0.0 - 0.1 x10E3/uL  Comprehensive metabolic panel  Result Value Ref Range   Glucose 84 65 - 99 mg/dL   BUN 18 6 - 24 mg/dL   Creatinine, Ser 1.02 0.76 - 1.27 mg/dL   GFR calc non Af Amer 81 >59 mL/min/1.73   GFR calc Af Amer 93 >59 mL/min/1.73   BUN/Creatinine Ratio 18 9 - 20   Sodium 144 134 - 144 mmol/L   Potassium 4.6 3.5 - 5.2 mmol/L   Chloride 103 96 - 106 mmol/L   CO2 26 18 - 29 mmol/L   Calcium 9.3 8.7 - 10.2 mg/dL   Total Protein 6.6 6.0 - 8.5 g/dL   Albumin 4.3 3.5 - 5.5 g/dL   Globulin, Total 2.3 1.5 - 4.5 g/dL   Albumin/Globulin Ratio 1.9 1.2 - 2.2   Bilirubin Total 0.8 0.0 - 1.2 mg/dL   Alkaline Phosphatase 67 39 - 117 IU/L   AST 33 0 - 40 IU/L   ALT 66 (H) 0 - 44 IU/L  Lipid Panel w/o Chol/HDL Ratio  Result Value Ref Range   Cholesterol, Total 178 100 - 199 mg/dL   Triglycerides 116 0 - 149 mg/dL   HDL 61 >39 mg/dL   VLDL Cholesterol Cal 23 5 - 40 mg/dL   LDL Calculated 94 0 - 99 mg/dL  TSH  Result Value Ref Range   TSH 8.370 (H) 0.450 - 4.500 uIU/mL  UA/M w/rflx Culture, Routine  Result Value Ref Range   Specific Gravity, UA  >1.030 (H) 1.005 - 1.030   pH, UA 5.5 5.0 - 7.5   Color, UA Amber (A) Yellow   Appearance Ur Clear Clear   Leukocytes, UA Negative Negative   Protein, UA 1+ (A) Negative/Trace   Glucose, UA Negative Negative   Ketones, UA Negative Negative  RBC, UA Negative Negative   Bilirubin, UA Negative Negative   Urobilinogen, Ur 0.2 0.2 - 1.0 mg/dL   Nitrite, UA Negative Negative   Microscopic Examination See below:   PSA  Result Value Ref Range   Prostate Specific Ag, Serum 1.2 0.0 - 4.0 ng/mL  Hepatitis C Antibody  Result Value Ref Range   Hep C Virus Ab <0.1 0.0 - 0.9 s/co ratio      Assessment & Plan:   Problem List Items Addressed This Visit    None    Visit Diagnoses    Acute right-sided low back pain with right-sided sciatica    -  Primary   Doing well with his chiropractor. Would like to try OMM. He does have some somatic dysfuntion that I think would benefit from OMT. Treated today as below.    Thoracic region somatic dysfunction       Somatic dysfunction of spine, lumbar       Sacral region somatic dysfunction       Somatic dysfunction of pelvis region       Segmental dysfunction of rib cage       Segmental dysfunction of abdomen          After verbal consent was obtained, patient was treated today with osteopathic manipulative medicine to the regions of the thorax, ribs, lumbar, pelvis, sacrum and abdomen using the techniques of myofascial release, counterstrain, muscle energy, HVLA and soft tissue. Areas of compensation relating to her primary pain source also treated. Patient tolerated the procedure well with good objective and good subjective improvement in symptoms. He left the room in good condition. He was advised to stay well hydrated and that he may have some soreness following the procedure. If not improving or worsening, he will call and come in. He will return for reevaluation  on a PRN basis.  Follow up plan: No Follow-up on file.

## 2016-04-08 ENCOUNTER — Ambulatory Visit: Payer: Self-pay | Admitting: Family Medicine

## 2016-04-19 ENCOUNTER — Ambulatory Visit (INDEPENDENT_AMBULATORY_CARE_PROVIDER_SITE_OTHER): Payer: Managed Care, Other (non HMO) | Admitting: Family Medicine

## 2016-04-19 ENCOUNTER — Encounter: Payer: Self-pay | Admitting: Family Medicine

## 2016-04-19 VITALS — BP 138/90 | HR 74 | Temp 97.9°F | Resp 17 | Ht 73.5 in | Wt 263.0 lb

## 2016-04-19 DIAGNOSIS — M2550 Pain in unspecified joint: Secondary | ICD-10-CM | POA: Diagnosis not present

## 2016-04-19 DIAGNOSIS — R7989 Other specified abnormal findings of blood chemistry: Secondary | ICD-10-CM

## 2016-04-19 DIAGNOSIS — R946 Abnormal results of thyroid function studies: Secondary | ICD-10-CM

## 2016-04-19 DIAGNOSIS — Z0000 Encounter for general adult medical examination without abnormal findings: Secondary | ICD-10-CM

## 2016-04-19 NOTE — Progress Notes (Signed)
BP 138/90 (BP Location: Left Arm, Patient Position: Sitting, Cuff Size: Large)   Pulse 74   Temp 97.9 F (36.6 C) (Oral)   Resp 17   Ht 6' 1.5" (1.867 m)   Wt 263 lb (119.3 kg)   SpO2 98%   BMI 34.23 kg/m    Subjective:    Patient ID: Nicholas Wall, male    DOB: [birth year], 61 y.o.   MRN: [id number]  HPI: Nicholas Wall is a 61 y.o. male presenting on 04/19/2016 for comprehensive medical examination. Current medical complaints include:  ARTHRALGIAS / JOINT ACHES Duration: about a year Pain: yes Symmetric: no Severity: moderate Quality: dull and aching Frequency: daily Context:  stable Decreased function/range of motion: yes  Erythema: no Swelling: no Heat or warmth: no Morning stiffness: yes for about an hour Aggravating factors: work, being in the car for a long time Alleviating factors: stretching Relief with NSAIDs?: no Treatments attempted:  rest, APAP and ibuprofen  Involved Joints:     Hands: yes bilateral    Wrists: yes bilateral     Elbows: yes bilateral    Shoulders: no     Back: yes     Hips: yes right    Knees: yes bilateral    Ankles: yes bilateral    Feet: yes bilateral  HYPOTHYROIDISM Thyroid control status:stable Satisfied with current treatment? Yes- but not on anything Medication side effects: not previously Medication compliance: poor compliance Recent dose adjustment:no Fatigue: yes Cold intolerance: no Heat intolerance: no Weight gain: yes Weight loss: no Constipation: no Diarrhea/loose stools: no Palpitations: yes Lower extremity edema: no Anxiety/depressed mood: yes  Interim Problems from his last visit: no  Depression Screen done today and results listed below:  Depression screen [facility] 2/9 04/19/2016 03/08/2016  Decreased Interest 0 0  Down, Depressed, Hopeless 0 0  PHQ - 2 Score 0 0   Past Medical History:  No past medical history on file.  Surgical History:  Past Surgical History:  Procedure Laterality Date  . [place]-- L4-L5 laminectomy  . HERNIA REPAIR      Medications:  Current Outpatient Prescriptions on File Prior to Visit  Medication Sig  . ibuprofen (ADVIL,MOTRIN) 800 MG tablet Take 1 tablet (800 mg total) by mouth every 8 (eight) hours as needed.  Marland Kitchen omeprazole (PRILOSEC) 20 MG capsule Take 1 capsule (20 mg total) by mouth daily.   No current facility-administered medications on file prior to visit.     Allergies:  No Known Allergies  Social History:  Social History   Social History  . Marital status: Married    Spouse name: N/A  . Number of children: N/A  . Years of education: N/A   Occupational History  . Not on file.   Social History Main Topics  . Smoking status: Never Smoker  . Smokeless tobacco: Never Used  . Alcohol use Yes     Comment: daily  . Drug use: No  . Sexual activity: Not on file   Other Topics Concern  . Not on file   Social History Narrative  . No narrative on file   History  Smoking Status  . Never Smoker  Smokeless Tobacco  . Never Used   History  Alcohol Use  . Yes    Comment: daily    Family History:  Family History  Problem Relation Age of Onset  . Hypertension Mother   . Hyperlipidemia Mother   . Diabetes Mother   .  Arthritis Mother   . Vision loss Mother   . Cancer Father   . Hyperlipidemia Father   . Arthritis Father   . Heart disease Father   . Hyperlipidemia Maternal Grandmother   . Hypertension Maternal Grandmother   . Alzheimer's disease Maternal Grandmother   . Heart disease Maternal Grandfather   . Hyperlipidemia Maternal Grandfather   . Hypertension Maternal Grandfather   . Cancer Paternal Grandmother     Past medical history, surgical history, medications, allergies, family history and social history reviewed with patient today and changes made to appropriate areas of the chart.   Review of Systems  Constitutional: Positive for diaphoresis (at night only). Negative for chills, fever,  malaise/fatigue and weight loss.  HENT: Positive for congestion and ear pain. Negative for ear discharge, hearing loss, nosebleeds, sinus pain, sore throat and tinnitus.   Eyes: Negative.   Respiratory: Negative.  Negative for stridor.   Cardiovascular: Negative.   Gastrointestinal: Positive for blood in stool (bright red, occasionally, usually after a harder bowel movent. ) and heartburn. Negative for abdominal pain, constipation, diarrhea, melena, nausea and vomiting.  Genitourinary: Negative.   Musculoskeletal: Positive for back pain, joint pain and myalgias. Negative for falls and neck pain.  Skin: Negative.        New mole on his L calf that has been itching   Neurological: Positive for tingling (in thumbs when driving- goes away when shaking it out). Negative for dizziness, tremors, sensory change, speech change, focal weakness, seizures, loss of consciousness, weakness and headaches.  Endo/Heme/Allergies: Negative.   Psychiatric/Behavioral: Negative.     All other ROS negative except what is listed above and in the HPI.      Objective:    BP 138/90 (BP Location: Left Arm, Patient Position: Sitting, Cuff Size: Large)   Pulse 74   Temp 97.9 F (36.6 C) (Oral)   Resp 17   Ht 6' 1.5" (1.867 m)   Wt 263 lb (119.3 kg)   SpO2 98%   BMI 34.23 kg/m   Wt Readings from Last 3 Encounters:  04/19/16 263 lb (119.3 kg)  03/27/16 266 lb (120.7 kg)  03/08/16 262 lb (118.8 kg)    Physical Exam  Constitutional: He is oriented to person, place, and time. He appears well-developed and well-nourished. No distress.  HENT:  Head: Normocephalic and atraumatic.  Right Ear: Hearing, tympanic membrane, external ear and ear canal normal.  Left Ear: Hearing, tympanic membrane, external ear and ear canal normal.  Nose: Nose normal.  Mouth/Throat: Uvula is midline, oropharynx is clear and moist and mucous membranes are normal. No oropharyngeal exudate.  Eyes: Conjunctivae, EOM and lids are normal.  Pupils are equal, round, and reactive to light. Right eye exhibits no discharge. Left eye exhibits no discharge. No scleral icterus.  Neck: Normal range of motion. Neck supple. No JVD present. No tracheal deviation present. No thyromegaly present.  Cardiovascular: Normal rate, regular rhythm, normal heart sounds and intact distal pulses.  Exam reveals no gallop and no friction rub.   No murmur heard. Pulmonary/Chest: Effort normal and breath sounds normal. No stridor. No respiratory distress. He has no wheezes. He has no rales. He exhibits no tenderness.  Abdominal: Soft. Bowel sounds are normal. He exhibits no distension and no mass. There is no tenderness. There is no rebound and no guarding. Hernia confirmed negative in the right inguinal area and confirmed negative in the left inguinal area.  Genitourinary: Rectum normal, prostate normal, testes normal and penis normal.  Prostate is not enlarged and not tender. Cremasteric reflex is present. Right testis shows no mass, no swelling and no tenderness. Right testis is descended. Cremasteric reflex is not absent on the right side. Left testis shows no mass, no swelling and no tenderness. Left testis is descended. Cremasteric reflex is not absent on the left side. Circumcised. No phimosis, paraphimosis, hypospadias, penile erythema or penile tenderness. No discharge found.  Musculoskeletal: Normal range of motion. He exhibits no edema, tenderness or deformity.  Lymphadenopathy:    He has no cervical adenopathy.  Neurological: He is alert and oriented to person, place, and time. He has normal reflexes. He displays normal reflexes. No cranial nerve deficit. He exhibits normal muscle tone. Coordination normal.  Skin: Skin is warm, dry and intact. No rash noted. He is not diaphoretic. No erythema. No pallor.  Psychiatric: He has a normal mood and affect. His speech is normal and behavior is normal. Judgment and thought content normal. Cognition and memory are  normal.  Nursing note and vitals reviewed.   Results for orders placed or performed in visit on 03/08/16  Microscopic Examination  Result Value Ref Range   WBC, UA 0-5 0 - 5 /hpf   RBC, UA None seen 0 - 2 /hpf   Epithelial Cells (non renal) None seen 0 - 10 /hpf   Mucus, UA Present (A) Not Estab.   Bacteria, UA Few (A) None seen/Few  CBC with Differential/Platelet  Result Value Ref Range   WBC 10.0 3.4 - 10.8 x10E3/uL   RBC 4.49 4.14 - 5.80 x10E6/uL   Hemoglobin 14.3 13.0 - 17.7 g/dL   Hematocrit 41.8 37.5 - 51.0 %   MCV 93 79 - 97 fL   MCH 31.8 26.6 - 33.0 pg   MCHC 34.2 31.5 - 35.7 g/dL   RDW 13.7 12.3 - 15.4 %   Platelets 220 150 - 379 x10E3/uL   Neutrophils 54 Not Estab. %   Lymphs 34 Not Estab. %   Monocytes 10 Not Estab. %   Eos 2 Not Estab. %   Basos 0 Not Estab. %   Neutrophils Absolute 5.3 1.4 - 7.0 x10E3/uL   Lymphocytes Absolute 3.4 (H) 0.7 - 3.1 x10E3/uL   Monocytes Absolute 1.0 (H) 0.1 - 0.9 x10E3/uL   EOS (ABSOLUTE) 0.2 0.0 - 0.4 x10E3/uL   Basophils Absolute 0.0 0.0 - 0.2 x10E3/uL   Immature Granulocytes 0 Not Estab. %   Immature Grans (Abs) 0.0 0.0 - 0.1 x10E3/uL  Comprehensive metabolic panel  Result Value Ref Range   Glucose 84 65 - 99 mg/dL   BUN 18 6 - 24 mg/dL   Creatinine, Ser 1.02 0.76 - 1.27 mg/dL   GFR calc non Af Amer 81 >59 mL/min/1.73   GFR calc Af Amer 93 >59 mL/min/1.73   BUN/Creatinine Ratio 18 9 - 20   Sodium 144 134 - 144 mmol/L   Potassium 4.6 3.5 - 5.2 mmol/L   Chloride 103 96 - 106 mmol/L   CO2 26 18 - 29 mmol/L   Calcium 9.3 8.7 - 10.2 mg/dL   Total Protein 6.6 6.0 - 8.5 g/dL   Albumin 4.3 3.5 - 5.5 g/dL   Globulin, Total 2.3 1.5 - 4.5 g/dL   Albumin/Globulin Ratio 1.9 1.2 - 2.2   Bilirubin Total 0.8 0.0 - 1.2 mg/dL   Alkaline Phosphatase 67 39 - 117 IU/L   AST 33 0 - 40 IU/L   ALT 66 (H) 0 - 44 IU/L  Lipid Panel w/o Chol/HDL  Ratio  Result Value Ref Range   Cholesterol, Total 178 100 - 199 mg/dL   Triglycerides 116 0 -  149 mg/dL   HDL 61 >39 mg/dL   VLDL Cholesterol Cal 23 5 - 40 mg/dL   LDL Calculated 94 0 - 99 mg/dL  TSH  Result Value Ref Range   TSH 8.370 (H) 0.450 - 4.500 uIU/mL  UA/M w/rflx Culture, Routine  Result Value Ref Range   Specific Gravity, UA >1.030 (H) 1.005 - 1.030   pH, UA 5.5 5.0 - 7.5   Color, UA Amber (A) Yellow   Appearance Ur Clear Clear   Leukocytes, UA Negative Negative   Protein, UA 1+ (A) Negative/Trace   Glucose, UA Negative Negative   Ketones, UA Negative Negative   RBC, UA Negative Negative   Bilirubin, UA Negative Negative   Urobilinogen, Ur 0.2 0.2 - 1.0 mg/dL   Nitrite, UA Negative Negative   Microscopic Examination See below:   PSA  Result Value Ref Range   Prostate Specific Ag, Serum 1.2 0.0 - 4.0 ng/mL  Hepatitis C Antibody  Result Value Ref Range   Hep C Virus Ab <0.1 0.0 - 0.9 s/co ratio      Assessment & Plan:   Problem List Items Addressed This Visit    None    Visit Diagnoses    Routine general medical examination at a health care facility    -  Primary   Vaccines up to date- shingles vaccine Rx given today. Screening labs normal last visit. Colonoscopy in the works. Continue diet and exercise. Call with concerns   Abnormal TSH       Rechecking thyroid panel today. Await results and treat as needed.    Relevant Orders   Thyroid Panel With TSH   Arthralgia, unspecified joint       Wide-spread. Likely due to arthritis. Will check labs to look for inflammatory arthritis. Advised NSAIDs. Call with any concerns.    Relevant Orders   RA Qn+CCP(IgG/A)+SjoSSA+SjoSSB   Antinuclear Antib (ANA)   Sed Rate (ESR)   Lyme Ab/Western Blot Reflex   Rocky mtn spotted fvr abs pnl(IgG+IgM)       Discussed aspirin prophylaxis for myocardial infarction prevention and decision was it was not indicated  LABORATORY TESTING:  Health maintenance labs ordered today as discussed above.   The natural history of prostate cancer and ongoing controversy regarding  screening and potential treatment outcomes of prostate cancer has been discussed with the patient. The meaning of a false positive PSA and a false negative PSA has been discussed. He indicates understanding of the limitations of this screening test and wishes to proceed with screening PSA testing.   IMMUNIZATIONS:   - Tdap: Tetanus vaccination status reviewed: last tetanus booster within 10 years. - Influenza: Refused - Pneumovax: Not applicable - Prevnar: Not applicable - Zostavax vaccine: Will get at pharmacy  SCREENING: - Colonoscopy: Ordered previously  Discussed with patient purpose of the colonoscopy is to detect colon cancer at curable precancerous or early stages   PATIENT COUNSELING:    Sexuality: Discussed sexually transmitted diseases, partner selection, use of condoms, avoidance of unintended pregnancy  and contraceptive alternatives.   Advised to avoid cigarette smoking.  I discussed with the patient that most people either abstain from alcohol or drink within safe limits (<=14/week and <=4 drinks/occasion for males, <=7/weeks and <= 3 drinks/occasion for females) and that the risk for alcohol disorders and other health effects rises proportionally with the number of  drinks per week and how often a drinker exceeds daily limits.  Discussed cessation/primary prevention of drug use and availability of treatment for abuse.   Diet: Encouraged to adjust caloric intake to maintain  or achieve ideal body weight, to reduce intake of dietary saturated fat and total fat, to limit sodium intake by avoiding high sodium foods and not adding table salt, and to maintain adequate dietary potassium and calcium preferably from fresh fruits, vegetables, and low-fat dairy products.    stressed the importance of regular exercise  Injury prevention: Discussed safety belts, safety helmets, smoke detector, smoking near bedding or upholstery.   Dental health: Discussed importance of regular tooth  brushing, flossing, and dental visits.   Follow up plan: NEXT PREVENTATIVE PHYSICAL DUE IN 1 YEAR. Return Pending lab results. Marland Kitchen

## 2016-04-19 NOTE — Patient Instructions (Addendum)
 Health Maintenance, Male A healthy lifestyle and preventive care is important for your health and wellness. Ask your health care provider about what schedule of regular examinations is right for you. What should I know about weight and diet?  Eat a Healthy Diet  Eat plenty of vegetables, fruits, whole grains, low-fat dairy products, and lean protein.  Do not eat a lot of foods high in solid fats, added sugars, or salt. Maintain a Healthy Weight  Regular exercise can help you achieve or maintain a healthy weight. You should:  Do at least 150 minutes of exercise each week. The exercise should increase your heart rate and make you sweat (moderate-intensity exercise).  Do strength-training exercises at least twice a week. Watch Your Levels of Cholesterol and Blood Lipids  Have your blood tested for lipids and cholesterol every 5 years starting at 59 years of age. If you are at high risk for heart disease, you should start having your blood tested when you are 59 years old. You may need to have your cholesterol levels checked more often if:  Your lipid or cholesterol levels are high.  You are older than 59 years of age.  You are at high risk for heart disease. What should I know about cancer screening? Many types of cancers can be detected early and may often be prevented. Lung Cancer  You should be screened every year for lung cancer if:  You are a current smoker who has smoked for at least 30 years.  You are a former smoker who has quit within the past 15 years.  Talk to your health care provider about your screening options, when you should start screening, and how often you should be screened. Colorectal Cancer  Routine colorectal cancer screening usually begins at 59 years of age and should be repeated every 5-10 years until you are 59 years old. You may need to be screened more often if early forms of precancerous polyps or small growths are found. Your health care provider  may recommend screening at an earlier age if you have risk factors for colon cancer.  Your health care provider may recommend using home test kits to check for hidden blood in the stool.  A small camera at the end of a tube can be used to examine your colon (sigmoidoscopy or colonoscopy). This checks for the earliest forms of colorectal cancer. Prostate and Testicular Cancer  Depending on your age and overall health, your health care provider may do certain tests to screen for prostate and testicular cancer.  Talk to your health care provider about any symptoms or concerns you have about testicular or prostate cancer. Skin Cancer  Check your skin from head to toe regularly.  Tell your health care provider about any new moles or changes in moles, especially if:  There is a change in a mole's size, shape, or color.  You have a mole that is larger than a pencil eraser.  Always use sunscreen. Apply sunscreen liberally and repeat throughout the day.  Protect yourself by wearing long sleeves, pants, a wide-brimmed hat, and sunglasses when outside. What should I know about heart disease, diabetes, and high blood pressure?  If you are 18-39 years of age, have your blood pressure checked every 3-5 years. If you are 40 years of age or older, have your blood pressure checked every year. You should have your blood pressure measured twice-once when you are at a hospital or clinic, and once when you are not at   hospital or clinic. Record the average of the two measurements. To check your blood pressure when you are not at a hospital or clinic, you can use:  An automated blood pressure machine at a pharmacy.  A home blood pressure monitor.  Talk to your health care provider about your target blood pressure.  If you are between 88-96 years old, ask your health care provider if you should take aspirin to prevent heart disease.  Have regular diabetes screenings by checking your fasting blood sugar  level.  If you are at a normal weight and have a low risk for diabetes, have this test once every three years after the age of 55.  If you are overweight and have a high risk for diabetes, consider being tested at a younger age or more often.  A one-time screening for abdominal aortic aneurysm (AAA) by ultrasound is recommended for men aged 49-75 years who are current or former smokers. What should I know about preventing infection? Hepatitis B  If you have a higher risk for hepatitis B, you should be screened for this virus. Talk with your health care provider to find out if you are at risk for hepatitis B infection. Hepatitis C  Blood testing is recommended for:  Everyone born from [birth year] through 1963.  Anyone with known risk factors for hepatitis C. Sexually Transmitted Diseases (STDs)  You should be screened each year for STDs including gonorrhea and chlamydia if:  You are sexually active and are younger than 59 years of age.  You are older than 59 years of age and your health care provider tells you that you are at risk for this type of infection.  Your sexual activity has changed since you were last screened and you are at an increased risk for chlamydia or gonorrhea. Ask your health care provider if you are at risk.  Talk with your health care provider about whether you are at high risk of being infected with HIV. Your health care provider may recommend a prescription medicine to help prevent HIV infection. What else can I do?  Schedule regular health, dental, and eye exams.  Stay current with your vaccines (immunizations).  Do not use any tobacco products, such as cigarettes, chewing tobacco, and e-cigarettes. If you need help quitting, ask your health care provider.  Limit alcohol intake to no more than 2 drinks per day. One drink equals 12 ounces of beer, 5 ounces of wine, or 1 ounces of hard liquor.  Do not use street drugs.  Do not share needles.  Ask your health  care provider for help if you need support or information about quitting drugs.  Tell your health care provider if you often feel depressed.  Tell your health care provider if you have ever been abused or do not feel safe at home. This information is not intended to replace advice given to you by your health care provider. Make sure you discuss any questions you have with your health care provider. Document Released: 07/06/2007 Document Revised: 09/06/2015 Document Reviewed: 10/11/2014 Elsevier Interactive Patient Education  2017 [employer] Shingles Vaccine: What You Need to Know 1. What is shingles? Shingles is a painful skin rash, often with blisters. It is also called Herpes Zoster, or just Zoster. A shingles rash usually appears on one side of the face or body and lasts from 2 to 4 weeks. Its main symptom is pain, which can be quite severe. Other symptoms of shingles can include fever, headache, chills and upset  stomach. Very rarely, a shingles infection can lead to pneumonia, hearing problems, blindness, brain inflammation (encephalitis) or death. For about 1 person in 5, severe pain can continue even long after the rash clears up. This is called post-herpetic neuralgia. Shingles is caused by the Varicella Zoster virus, the same virus that causes chickenpox. Only someone who has had chickenpox-or, rarely, has gotten chickenpox vaccine-can get shingles. The virus stays in your body, and can cause shingles many years later. You can't catch shingles from another person with shingles. However, a person who has never had chickenpox (or chicken pox vaccine) could get chickenpox from someone with shingles. This is not very common. Shingles is far more common in people 2 years of age and older than in younger people. It is also more common in people whose immune systems are weakened because of a disease such as cancer, or drugs such as steroids or chemotherapy. At least 1 million people a year in  the [place] get shingles. 2. Shingles vaccine A vaccine for shingles was licensed in 4034. In clinical trials, the vaccine reduced the risk of shingles by 50%. It can also reduce pain in people who still get shingles after being vaccinated. A single dose of shingles vaccine is recommended for adults 31 years of age and older. 3. Some people should not get shingles vaccine or should wait A person should not get shingles vaccine who:  has ever had a life-threatening allergic reaction to gelatin, the antibiotic neomycin, or any other component of shingles vaccine. Tell your doctor if you have any severe allergies.  has a weakened immune system because of current:  AIDS or another disease that affects the immune system,  treatment with drugs that affect the immune system, such as prolonged use of high-dose steroids,  cancer treatment such as radiation or chemotherapy,  cancer affecting the bone marrow or lymphatic system, such as leukemia or lymphoma.  is pregnant, or might be pregnant. Women should not become pregnant until at least 4 weeks after getting shingles vaccine. Someone with a minor acute illness, such as a cold, may be vaccinated. But anyone with a moderate or severe acute illness should usually wait until they recover before get ting the vaccine. This includes anyone with a temperature of 101.3 F or higher. 4. What are the risks from shingles vaccine? A vaccine, like any medicine, could possibly cause serious problems, such as severe allergic reactions. However, the risk of a vaccine causing serious harm, or death, is extremely small. No serious problems have been identified with shingles vaccine. Mild problems   Redness, soreness, swelling, or itching at the site of the injection (about 1 person in 3).  Headache (about 1 person in 77). Like all vaccines, shingles vaccine is being closely monitored for unusual or severe problems. 5. What if there is a serious  reaction? What should I look for?   Look for anything that concerns you, such as signs of a severe allergic reaction, very high fever, or behavior changes. Signs of a severe allergic reaction can include hives, swelling of the face and throat, difficulty breathing, a fast heartbeat, dizziness, and weakness. These would start a few minutes to a few hours after the vaccination. What should I do?   If you think it is a severe allergic reaction or other emergency that can't wait, call 9-1-1 or get the person to the nearest hospital. Otherwise, call your doctor.  Afterward, the reaction should be reported to the Vaccine Adverse Event Reporting  System (VAERS). Your doctor might file this report, or you can do it yourself through the VAERS web site at www.vaers.SamedayNews.es or by calling (289) 638-5286. VAERS is only for reporting reactions. They do not give medical advice.  6. How can I learn more?  Ask your doctor.  Call your local or state health department.  Contact the Centers for Disease Control and Prevention (CDC):  Call (936) 628-5949(1-800-CDC-INFO) or  Visit CDC's website at http://hunter.com/ CDC Vaccine Information Statement (VIS) Shingles Vaccine (10/27/2007) This information is not intended to replace advice given to you by your health care provider. Make sure you discuss any questions you have with your health care provider. Document Released: 11/04/2005 Document Revised: 12/02/2015 Document Reviewed: 12/02/2015 Elsevier Interactive Patient Education  2017 [employer].

## 2016-04-23 ENCOUNTER — Telehealth: Payer: Self-pay | Admitting: Family Medicine

## 2016-04-23 DIAGNOSIS — M79671 Pain in right foot: Secondary | ICD-10-CM

## 2016-04-23 DIAGNOSIS — M79672 Pain in left foot: Secondary | ICD-10-CM

## 2016-04-23 DIAGNOSIS — E039 Hypothyroidism, unspecified: Secondary | ICD-10-CM

## 2016-04-23 DIAGNOSIS — B353 Tinea pedis: Secondary | ICD-10-CM

## 2016-04-23 LAB — ROCKY MTN SPOTTED FVR ABS PNL(IGG+IGM)
RMSF IgG: NEGATIVE
RMSF IgM: 0.31 index (ref 0.00–0.89)

## 2016-04-23 LAB — THYROID PANEL WITH TSH
Free Thyroxine Index: 1.3 (ref 1.2–4.9)
T3 UPTAKE RATIO: 23 % — AB (ref 24–39)
T4, Total: 5.8 ug/dL (ref 4.5–12.0)
TSH: 9.73 u[IU]/mL — ABNORMAL HIGH (ref 0.450–4.500)

## 2016-04-23 LAB — SEDIMENTATION RATE: Sed Rate: 2 mm/hr (ref 0–30)

## 2016-04-23 LAB — ANA: Anti Nuclear Antibody(ANA): NEGATIVE

## 2016-04-23 LAB — LYME AB/WESTERN BLOT REFLEX

## 2016-04-23 LAB — RA QN+CCP(IGG/A)+SJOSSA+SJOSSB
CYCLIC CITRULLIN PEPTIDE AB: 5 U (ref 0–19)
ENA SSA (RO) Ab: <0.2 AI (ref 0.0–0.9)
Rhuematoid fact SerPl-aCnc: <10 IU/mL (ref 0.0–13.9)

## 2016-04-23 MED ORDER — LEVOTHYROXINE SODIUM 25 MCG PO TABS: 25.0000 ug | ORAL_TABLET | Freq: Every day | ORAL | 2 refills | Status: DC

## 2016-04-23 NOTE — Telephone Encounter (Signed)
Called and discussed results with patient. He is willing to take levothryoxine. Rx sent to his pharmacy. Will recheck labs in 6 weeks. He notes that he is having a lot of trouble with athlete's foot bilaterally and some foot pain. Would like to see a podiatrist. Referral generated today.

## 2016-05-14 ENCOUNTER — Ambulatory Visit (INDEPENDENT_AMBULATORY_CARE_PROVIDER_SITE_OTHER): Payer: Managed Care, Other (non HMO)

## 2016-05-14 ENCOUNTER — Ambulatory Visit (INDEPENDENT_AMBULATORY_CARE_PROVIDER_SITE_OTHER): Payer: Managed Care, Other (non HMO) | Admitting: Podiatry

## 2016-05-14 ENCOUNTER — Encounter: Payer: Self-pay | Admitting: Podiatry

## 2016-05-14 DIAGNOSIS — M79671 Pain in right foot: Secondary | ICD-10-CM

## 2016-05-14 DIAGNOSIS — B353 Tinea pedis: Secondary | ICD-10-CM

## 2016-05-14 DIAGNOSIS — M79672 Pain in left foot: Secondary | ICD-10-CM | POA: Diagnosis not present

## 2016-05-14 DIAGNOSIS — B351 Tinea unguium: Secondary | ICD-10-CM

## 2016-05-14 DIAGNOSIS — Z79899 Other long term (current) drug therapy: Secondary | ICD-10-CM | POA: Diagnosis not present

## 2016-05-14 MED ORDER — TERBINAFINE HCL 250 MG PO TABS: 250.0000 mg | ORAL_TABLET | Freq: Every day | ORAL | 0 refills | Status: DC

## 2016-05-14 MED ORDER — CLOTRIMAZOLE-BETAMETHASONE 1-0.05 % EX CREA: 1.0000 "application " | TOPICAL_CREAM | Freq: Two times a day (BID) | CUTANEOUS | 3 refills | Status: DC

## 2016-05-15 LAB — HEPATIC FUNCTION PANEL
ALBUMIN: 4.7 g/dL (ref 3.5–5.5)
ALT: 65 IU/L — ABNORMAL HIGH (ref 0–44)
AST: 42 IU/L — ABNORMAL HIGH (ref 0–40)
Alkaline Phosphatase: 77 IU/L (ref 39–117)
BILIRUBIN TOTAL: 0.6 mg/dL (ref 0.0–1.2)
BILIRUBIN, DIRECT: 0.18 mg/dL (ref 0.00–0.40)
Total Protein: 7.1 g/dL (ref 6.0–8.5)

## 2016-05-15 NOTE — Progress Notes (Signed)
   Subjective: Patient presents today for constant aching, burning pain to the plantar aspect of bilateral feet onset 2-3 years. He states his feet feel stiff. Patient reports he used to use a public shower at his job several years ago. He reports toenail fungus to all toes bilaterally. Walking increases his pain. He has not done anything for treatment. Patient is here for further evaluation and treatment  Objective: Physical Exam General: The patient is alert and oriented x3 in no acute distress.  Dermatology: Hyperkeratotic, discolored, thickened, onychodystrophy of nails noted bilaterally.  Skin is warm, dry and supple bilateral lower extremities. Negative for open lesions or macerations.  Vascular: Palpable pedal pulses bilaterally. No edema or erythema noted. Capillary refill within normal limits.  Neurological: Epicritic and protective threshold grossly intact bilaterally.   Musculoskeletal Exam: Range of motion within normal limits to all pedal and ankle joints bilateral. Muscle strength 5/5 in all groups bilateral.   Radiographic Exam:  Normal osseous mineralization. Joint spaces preserved. No fracture/dislocation/boney destruction.  Assessment: 1. Moccasin-type tinea pedis bilaterally 2. Bilateral foot stiffness 3. onychomycosis bilateral toenails 4. Gastroc equinus bilaterally  Plan of Care:  #1 Patient was evaluated. #2 Orders for liver function tests were ordered today.  #3 Today nail biopsy was taken and sent to pathology for fungal culture. #4 prescription for Lotrisone cream #5 prescription for Lamisil 250 mg #90 #6 recommended daily stretching #7 patient is to return to clinic in 4 weeks to discuss fungal culture nail biopsy findings and LFT results and discuss different treatment options.   Edrick Kins, DPM [facility]  Dr. Edrick Kins, [address]                                        [place], [place] [address]                  Office [phone]  Fax [phone]

## 2016-05-16 ENCOUNTER — Telehealth: Payer: Self-pay | Admitting: *Deleted

## 2016-05-16 NOTE — Telephone Encounter (Addendum)
-----   Message from Edrick Kins, DPM sent at 05/16/2016  3:38 PM EDT ----- Regarding: Please contact patient Please contact patient and let him know his liver enzymes are slightly elevated. Discontinue Lamisil for now. Follow up with primary care. Laser is an option if he would like.  Thanks, Dr. Amalia Hailey. Left message for pt to call asap for results.05/17/2016-Pt called for Dr. Amalia Hailey review of results, and I explained he had elevated enzymes and I would fax to his PCP. Pt states he sees Dr. Park Liter in [place] states fax [phone]. Faxed Hepatic function of 05/14/2016 to Dr. Park Liter.

## 2016-05-17 NOTE — Telephone Encounter (Signed)
-----   Message from Edrick Kins, DPM sent at 05/16/2016  3:38 PM EDT ----- Regarding: Please contact patient Please contact patient and let him know his liver enzymes are slightly elevated. Discontinue Lamisil for now. Follow up with primary care. Laser is an option if he would like.  Thanks, Dr. Amalia Hailey

## 2016-06-14 ENCOUNTER — Ambulatory Visit: Payer: Managed Care, Other (non HMO) | Admitting: Podiatry

## 2016-07-01 ENCOUNTER — Encounter: Payer: Self-pay | Admitting: Family Medicine

## 2016-07-20 ENCOUNTER — Other Ambulatory Visit: Payer: Self-pay | Admitting: Family Medicine

## 2016-07-22 NOTE — Telephone Encounter (Signed)
Call pt needs thyroid labs done

## 2016-08-10 ENCOUNTER — Other Ambulatory Visit: Payer: Self-pay | Admitting: Family Medicine

## 2016-08-12 NOTE — Telephone Encounter (Signed)
rx

## 2016-08-12 NOTE — Telephone Encounter (Signed)
Needs appointment for labs only please

## 2016-08-12 NOTE — Telephone Encounter (Signed)
Patient schedule a lab visit for med refill on 08/15/16 in the a.m.

## 2016-08-15 ENCOUNTER — Other Ambulatory Visit: Payer: Managed Care, Other (non HMO)

## 2016-08-15 DIAGNOSIS — E039 Hypothyroidism, unspecified: Secondary | ICD-10-CM

## 2016-08-16 ENCOUNTER — Other Ambulatory Visit: Payer: Self-pay | Admitting: Family Medicine

## 2016-08-16 ENCOUNTER — Telehealth: Payer: Self-pay | Admitting: Family Medicine

## 2016-08-16 DIAGNOSIS — E039 Hypothyroidism, unspecified: Secondary | ICD-10-CM

## 2016-08-16 LAB — TSH: TSH: 7.34 u[IU]/mL — AB (ref 0.450–4.500)

## 2016-08-16 MED ORDER — LEVOTHYROXINE SODIUM 50 MCG PO TABS: 50.0000 ug | ORAL_TABLET | Freq: Every day | ORAL | 1 refills | Status: DC

## 2016-08-16 NOTE — Telephone Encounter (Signed)
Called patient, no answer, left message for patient to return my call.  

## 2016-08-16 NOTE — Telephone Encounter (Signed)
Patient notified. Lab appointment scheduled.

## 2016-08-16 NOTE — Telephone Encounter (Signed)
Please let him know that his thyroid is still a little low. I'd like him to go up to 50mcg and we'll recheck in 6 weeks and adjust as needed. Thanks!

## 2016-08-31 ENCOUNTER — Other Ambulatory Visit: Payer: Self-pay | Admitting: Family Medicine

## 2016-09-02 ENCOUNTER — Other Ambulatory Visit: Payer: Self-pay | Admitting: Family Medicine

## 2016-09-27 ENCOUNTER — Other Ambulatory Visit: Payer: Managed Care, Other (non HMO)

## 2016-09-27 ENCOUNTER — Telehealth: Payer: Self-pay | Admitting: Family Medicine

## 2016-09-27 DIAGNOSIS — E039 Hypothyroidism, unspecified: Secondary | ICD-10-CM

## 2016-09-27 NOTE — Telephone Encounter (Signed)
Routing to provider  

## 2016-09-27 NOTE — Telephone Encounter (Signed)
Patient needs Dr Wynetta Emery to send in a new script for his Levothyroxine 50 mcg as the pharmacy has been filling it at 20mcg and he has been taking 2 which will made him run short and he will be out of the med in a few days  Thank you  [facility] [phone]

## 2016-09-27 NOTE — Telephone Encounter (Signed)
I sent that in for him already- he has a Rx waiting for him at the pharmacy.

## 2016-09-27 NOTE — Telephone Encounter (Signed)
Patient notified

## 2016-09-28 LAB — TSH: TSH: 4.28 u[IU]/mL (ref 0.450–4.500)

## 2016-09-30 ENCOUNTER — Telehealth: Payer: Self-pay | Admitting: Family Medicine

## 2016-09-30 DIAGNOSIS — E039 Hypothyroidism, unspecified: Secondary | ICD-10-CM

## 2016-09-30 MED ORDER — LEVOTHYROXINE SODIUM 75 MCG PO TABS: 75.0000 ug | ORAL_TABLET | Freq: Every day | ORAL | 3 refills | Status: DC

## 2016-09-30 NOTE — Telephone Encounter (Signed)
Called Edu to give him his results. His thyroid is better, but still on the high end of normal. I'd like to increase his dose to 37mcg and recheck again in 6 weeks. I've sent a new Rx to his pharmacy, but he can take 1.5 of the 59mcg to use them up. LMOM for him to call back. OK To give him this message if he calls back. Mychart message also sent to patient.

## 2016-11-28 ENCOUNTER — Telehealth: Payer: Self-pay | Admitting: Family Medicine

## 2016-11-28 NOTE — Telephone Encounter (Signed)
Called and left a message to let patient know that he does need blood work done before his medication can be refilled.

## 2016-11-28 NOTE — Telephone Encounter (Signed)
Copied from [place] #[id number]. Topic: Quick Communication - See Telephone Encounter >> Nov 28, 2016 11:00 AM Clack, Laban Emperor wrote: CRM for notification. See Telephone encounter for:   Patient wanted to know if he needs to come in for labs to get a med refill on his levothyroxine or needs to make an appt. He also states that he has about 6 pills left.   11/28/16.

## 2016-11-29 ENCOUNTER — Other Ambulatory Visit: Payer: Managed Care, Other (non HMO)

## 2016-11-29 DIAGNOSIS — E039 Hypothyroidism, unspecified: Secondary | ICD-10-CM

## 2016-11-30 LAB — TSH: TSH: 2.29 u[IU]/mL (ref 0.450–4.500)

## 2016-12-02 ENCOUNTER — Other Ambulatory Visit: Payer: Self-pay | Admitting: Family Medicine

## 2016-12-02 MED ORDER — LEVOTHYROXINE SODIUM 75 MCG PO TABS: 75.0000 ug | ORAL_TABLET | Freq: Every day | ORAL | 3 refills | Status: DC

## 2016-12-03 ENCOUNTER — Encounter: Payer: Self-pay | Admitting: Family Medicine

## 2017-02-27 ENCOUNTER — Other Ambulatory Visit: Payer: Self-pay | Admitting: Family Medicine

## 2017-08-21 ENCOUNTER — Other Ambulatory Visit: Payer: Self-pay | Admitting: Family Medicine

## 2017-08-21 NOTE — Telephone Encounter (Signed)
Prilosec 20 mg refill request  LOV 04/19/16 with Dr. Wynetta Emery   No future appts noted.  LR:  02/27/17   #90  Refills:  1  [id number] [place] [place]

## 2017-11-23 ENCOUNTER — Other Ambulatory Visit: Payer: Self-pay | Admitting: Family Medicine

## 2017-11-24 MED ORDER — LEVOTHYROXINE SODIUM 75 MCG PO TABS: 75.0000 ug | ORAL_TABLET | Freq: Every day | ORAL | 0 refills | Status: DC

## 2017-11-24 NOTE — Telephone Encounter (Signed)
[facility]; notified her that refill should be for #30; refill for #90 granted in error; pt needs office visit; last office visit 04/09/16; she verbalized understanding.

## 2017-11-24 NOTE — Telephone Encounter (Addendum)
Pt's last visit 04/19/16; no upcoming visits noted; left message on voicemail [phone]; when pt calls back please schedule office visit.

## 2017-11-24 NOTE — Addendum Note (Signed)
Addended by: Addison Naegeli on: 11/24/2017 03:12 PM   Modules accepted: Orders

## 2017-11-28 ENCOUNTER — Encounter: Payer: Self-pay | Admitting: Family Medicine

## 2017-11-28 ENCOUNTER — Ambulatory Visit (INDEPENDENT_AMBULATORY_CARE_PROVIDER_SITE_OTHER): Payer: Managed Care, Other (non HMO) | Admitting: Family Medicine

## 2017-11-28 VITALS — BP 149/90 | HR 64 | Temp 97.8°F | Ht 74.0 in | Wt 280.8 lb

## 2017-11-28 DIAGNOSIS — K219 Gastro-esophageal reflux disease without esophagitis: Secondary | ICD-10-CM | POA: Diagnosis not present

## 2017-11-28 DIAGNOSIS — E039 Hypothyroidism, unspecified: Secondary | ICD-10-CM | POA: Diagnosis not present

## 2017-11-28 DIAGNOSIS — M2550 Pain in unspecified joint: Secondary | ICD-10-CM

## 2017-11-28 DIAGNOSIS — R202 Paresthesia of skin: Secondary | ICD-10-CM

## 2017-11-28 DIAGNOSIS — R030 Elevated blood-pressure reading, without diagnosis of hypertension: Secondary | ICD-10-CM

## 2017-11-28 DIAGNOSIS — R1319 Other dysphagia: Secondary | ICD-10-CM

## 2017-11-28 DIAGNOSIS — Z13220 Encounter for screening for lipoid disorders: Secondary | ICD-10-CM

## 2017-11-28 DIAGNOSIS — M65332 Trigger finger, left middle finger: Secondary | ICD-10-CM | POA: Diagnosis not present

## 2017-11-28 DIAGNOSIS — Z125 Encounter for screening for malignant neoplasm of prostate: Secondary | ICD-10-CM

## 2017-11-28 DIAGNOSIS — R1310 Dysphagia, unspecified: Secondary | ICD-10-CM

## 2017-11-28 LAB — MICROALBUMIN, URINE WAIVED
Creatinine, Urine Waived: 200 mg/dL (ref 10–300)
Microalb, Ur Waived: 30 mg/L — ABNORMAL HIGH (ref 0–19)
Microalb/Creat Ratio: <30 mg/g (ref <30)

## 2017-11-28 LAB — UA/M W/RFLX CULTURE, ROUTINE
BILIRUBIN UA: NEGATIVE
GLUCOSE, UA: NEGATIVE
KETONES UA: NEGATIVE
Leukocytes, UA: NEGATIVE
Nitrite, UA: NEGATIVE
PROTEIN UA: NEGATIVE
RBC UA: NEGATIVE
SPEC GRAV UA: 1.02 (ref 1.005–1.030)
UUROB: 0.2 mg/dL (ref 0.2–1.0)
pH, UA: 5.5 (ref 5.0–7.5)

## 2017-11-28 LAB — BAYER DCA HB A1C WAIVED: HB A1C: 5 % (ref <7.0)

## 2017-11-28 NOTE — Progress Notes (Signed)
BP (!) 149/90 (BP Location: Left Arm, Cuff Size: Large)   Pulse 64   Temp 97.8 F (36.6 C) (Oral)   Ht 6' 2"  (1.88 m)   Wt 280 lb 12.8 oz (127.4 kg)   SpO2 98%   BMI 36.05 kg/m    Subjective:    Patient ID: Nicholas Wall, male    DOB: 08-06-1955, 62 y.o.   MRN: [id number]  HPI: Nicholas Wall is a 62 y.o. male  Chief Complaint  Patient presents with  . Hypothyroidism   Has been having problems with arthritis. Has a trigger finger on his L middle finger  HYPOTHYROIDISM Thyroid control status:stable Satisfied with current treatment? yes Medication side effects: no Medication compliance: good compliance Recent dose adjustment:no Fatigue: yes Cold intolerance: no Heat intolerance: no Weight gain: yes Weight loss: no Constipation: no Diarrhea/loose stools: no Palpitations: no Lower extremity edema: no Anxiety/depressed mood: no  DYSPHAGIA Duration: chronic Description of symptom: in his chest Onset: Several seconds after swallowing Location of dysphagia: near sternal notch Dysphagia to solids only: yes Dysphagia to solids & liquids: no  Frequency:intermittent  Progressively getting worse: yes Alleviatiating factors: vomiting Provoking factors: certain foods Status: worse EGD: no Weight loss: no Sensation of lump in throat: yes Heartburn: yes Odynophagia: no Nausea: no Vomiting: yes Drooling/nasal regurgitation/food spillage: no Coughing/choking/dysphonia: no Dysarthria: no Hematemesis: no Regurgitation of undigested food/halitosis: no Chest pain: no   Relevant past medical, surgical, family and social history reviewed and updated as indicated. Interim medical history since our last visit reviewed. Allergies and medications reviewed and updated.  Review of Systems  Constitutional: Negative.   Respiratory: Negative.   Cardiovascular: Negative.   Gastrointestinal: Positive for vomiting. Negative for abdominal distention, abdominal pain, anal  bleeding, blood in stool, constipation, diarrhea, nausea and rectal pain.  Musculoskeletal: Positive for arthralgias and joint swelling. Negative for back pain, gait problem, myalgias, neck pain and neck stiffness.  Skin: Negative.   Neurological: Negative.   Hematological: Negative.   Psychiatric/Behavioral: Negative.     Per HPI unless specifically indicated above     Objective:    BP (!) 149/90 (BP Location: Left Arm, Cuff Size: Large)   Pulse 64   Temp 97.8 F (36.6 C) (Oral)   Ht 6' 2"  (1.88 m)   Wt 280 lb 12.8 oz (127.4 kg)   SpO2 98%   BMI 36.05 kg/m   Wt Readings from Last 3 Encounters:  11/28/17 280 lb 12.8 oz (127.4 kg)  04/19/16 263 lb (119.3 kg)  03/27/16 266 lb (120.7 kg)    Physical Exam  Constitutional: He is oriented to person, place, and time. He appears well-developed and well-nourished. No distress.  HENT:  Head: Normocephalic and atraumatic.  Right Ear: Hearing normal.  Left Ear: Hearing normal.  Nose: Nose normal.  Eyes: Conjunctivae and lids are normal. Right eye exhibits no discharge. Left eye exhibits no discharge. No scleral icterus.  Cardiovascular: Normal rate, regular rhythm, normal heart sounds and intact distal pulses. Exam reveals no gallop and no friction rub.  No murmur heard. Pulmonary/Chest: Effort normal and breath sounds normal. No stridor. No respiratory distress. He has no wheezes. He has no rales. He exhibits no tenderness.  Abdominal: Soft. Bowel sounds are normal. He exhibits no distension and no mass. There is no tenderness. There is no rebound and no guarding. No hernia.  Musculoskeletal: He exhibits edema and tenderness. He exhibits no deformity.  Trigger finger middle finger L hand  Neurological: He is alert and  oriented to person, place, and time.  Skin: Skin is warm, dry and intact. Capillary refill takes less than 2 seconds. No rash noted. He is not diaphoretic. No erythema. No pallor.  Psychiatric: He has a normal mood and  affect. His speech is normal and behavior is normal. Judgment and thought content normal. Cognition and memory are normal.  Nursing note and vitals reviewed.   Results for orders placed or performed in visit on 11/28/17  Bayer DCA Hb A1c Waived  Result Value Ref Range   HB A1C (BAYER DCA - WAIVED) 5.0 <7.0 %  CBC with Differential/Platelet  Result Value Ref Range   WBC 7.2 3.4 - 10.8 x10E3/uL   RBC 4.21 4.14 - 5.80 x10E6/uL   Hemoglobin 14.3 13.0 - 17.7 g/dL   Hematocrit 39.2 37.5 - 51.0 %   MCV 93 79 - 97 fL   MCH 34.0 (H) 26.6 - 33.0 pg   MCHC 36.5 (H) 31.5 - 35.7 g/dL   RDW 13.1 12.3 - 15.4 %   Platelets 210 150 - 450 x10E3/uL   Neutrophils 60 Not Estab. %   Lymphs 26 Not Estab. %   Monocytes 10 Not Estab. %   Eos 3 Not Estab. %   Basos 1 Not Estab. %   Neutrophils Absolute 4.3 1.4 - 7.0 x10E3/uL   Lymphocytes Absolute 1.9 0.7 - 3.1 x10E3/uL   Monocytes Absolute 0.7 0.1 - 0.9 x10E3/uL   EOS (ABSOLUTE) 0.2 0.0 - 0.4 x10E3/uL   Basophils Absolute 0.0 0.0 - 0.2 x10E3/uL   Immature Granulocytes 0 Not Estab. %   Immature Grans (Abs) 0.0 0.0 - 0.1 x10E3/uL   Hematology Comments: Note:   Comprehensive metabolic panel  Result Value Ref Range   Glucose 100 (H) 65 - 99 mg/dL   BUN 15 6 - 24 mg/dL   Creatinine, Ser 0.84 0.76 - 1.27 mg/dL   GFR calc non Af Amer 96 >59 mL/min/1.73   GFR calc Af Amer 111 >59 mL/min/1.73   BUN/Creatinine Ratio 18 9 - 20   Sodium 140 134 - 144 mmol/L   Potassium 4.2 3.5 - 5.2 mmol/L   Chloride 102 96 - 106 mmol/L   CO2 25 20 - 29 mmol/L   Calcium 8.8 8.7 - 10.2 mg/dL   Total Protein 6.5 6.0 - 8.5 g/dL   Albumin 4.5 3.5 - 5.5 g/dL   Globulin, Total 2.0 1.5 - 4.5 g/dL   Albumin/Globulin Ratio 2.3 (H) 1.2 - 2.2   Bilirubin Total 0.6 0.0 - 1.2 mg/dL   Alkaline Phosphatase 74 39 - 117 IU/L   AST 87 (H) 0 - 40 IU/L   ALT 116 (H) 0 - 44 IU/L  Lipid Panel w/o Chol/HDL Ratio  Result Value Ref Range   Cholesterol, Total 166 100 - 199 mg/dL    Triglycerides 84 0 - 149 mg/dL   HDL 56 >39 mg/dL   VLDL Cholesterol Cal 17 5 - 40 mg/dL   LDL Calculated 93 0 - 99 mg/dL  PSA  Result Value Ref Range   Prostate Specific Ag, Serum 0.7 0.0 - 4.0 ng/mL  TSH  Result Value Ref Range   TSH 2.830 0.450 - 4.500 uIU/mL  UA/M w/rflx Culture, Routine  Result Value Ref Range   Specific Gravity, UA 1.020 1.005 - 1.030   pH, UA 5.5 5.0 - 7.5   Color, UA Yellow Yellow   Appearance Ur Clear Clear   Leukocytes, UA Negative Negative   Protein, UA Negative Negative/Trace  Glucose, UA Negative Negative   Ketones, UA Negative Negative   RBC, UA Negative Negative   Bilirubin, UA Negative Negative   Urobilinogen, Ur 0.2 0.2 - 1.0 mg/dL   Nitrite, UA Negative Negative  RA Qn+CCP(IgG/A)+SjoSSA+SjoSSB  Result Value Ref Range   Rhuematoid fact SerPl-aCnc <10.0 0.0 - 13.9 IU/mL   ENA SSA (RO) Ab WILL FOLLOW    ENA SSB (LA) Ab WILL FOLLOW    Cyclic Citrullin Peptide Ab WILL FOLLOW   Antinuclear Antib (ANA)  Result Value Ref Range   Anti Nuclear Antibody(ANA) WILL FOLLOW   Sed Rate (ESR)  Result Value Ref Range   Sed Rate 7 0 - 30 mm/hr  CK (Creatine Kinase)  Result Value Ref Range   Total CK 467 (H) 24 - 204 U/L  VITAMIN D 25 Hydroxy (Vit-D Deficiency, Fractures)  Result Value Ref Range   Vit D, 25-Hydroxy 17.8 (L) 30.0 - 100.0 ng/mL  Uric acid  Result Value Ref Range   Uric Acid 7.7 3.7 - 8.6 mg/dL  Microalbumin, Urine Waived  Result Value Ref Range   Microalb, Ur Waived 30 (H) 0 - 19 mg/L   Creatinine, Urine Waived 200 10 - 300 mg/dL   Microalb/Creat Ratio <30 <30 mg/g      Assessment & Plan:   Problem List Items Addressed This Visit      Digestive   GERD (gastroesophageal reflux disease)    Stable on current regimen. Continue current regimen. Continue to monitor. Call with any concerns.       Relevant Orders   CBC with Differential/Platelet (Completed)   Comprehensive metabolic panel (Completed)   UA/M w/rflx Culture, Routine  (Completed)   Esophageal dysphagia    Will refer to GI. Call with any concerns. Continue to monitor. Call with any concerns.       Relevant Orders   Ambulatory referral to Gastroenterology     Endocrine   Hypothyroidism - Primary    Checking labs today. Await results. Call with any concerns. Will treat as needed.       Relevant Orders   CBC with Differential/Platelet (Completed)   Comprehensive metabolic panel (Completed)   TSH (Completed)   UA/M w/rflx Culture, Routine (Completed)    Other Visit Diagnoses    Trigger middle finger of left hand       Will refer to ortho. Call with any concerns. Continue to monitor.    Relevant Orders   Ambulatory referral to Hand Surgery   CBC with Differential/Platelet (Completed)   Comprehensive metabolic panel (Completed)   Paresthesias       Checking labs today. Await results. Call with any concerns.    Relevant Orders   Bayer DCA Hb A1c Waived (Completed)   CBC with Differential/Platelet (Completed)   Comprehensive metabolic panel (Completed)   TSH (Completed)   UA/M w/rflx Culture, Routine (Completed)   Screening for cholesterol level       Checking labs today. Await results.    Relevant Orders   Lipid Panel w/o Chol/HDL Ratio (Completed)   Screening for prostate cancer       Checking labs today. Await results.    Relevant Orders   PSA (Completed)   Arthralgia, unspecified joint       Will check labs. Likely osteoarthritis. Call with any concerns.    Relevant Orders   RA Qn+CCP(IgG/A)+SjoSSA+SjoSSB (Completed)   Antinuclear Antib (ANA) (Completed)   Sed Rate (ESR) (Completed)   CK (Creatine Kinase) (Completed)   VITAMIN D 25 Hydroxy (  Vit-D Deficiency, Fractures) (Completed)   Uric acid (Completed)   Elevated blood pressure reading       Checking labs. Work on [employer]. Recheck 1 month, if still elevated, will start medicine.    Relevant Orders   Microalbumin, Urine Waived (Completed)       Follow up plan: Return in  about 4 weeks (around 12/26/2017) for Physical.

## 2017-11-29 DIAGNOSIS — R1319 Other dysphagia: Secondary | ICD-10-CM | POA: Insufficient documentation

## 2017-11-29 DIAGNOSIS — R1310 Dysphagia, unspecified: Secondary | ICD-10-CM | POA: Insufficient documentation

## 2017-11-29 MED ORDER — LEVOTHYROXINE SODIUM 75 MCG PO TABS: 75.0000 ug | ORAL_TABLET | Freq: Every day | ORAL | 3 refills | Status: DC

## 2017-11-29 NOTE — Assessment & Plan Note (Signed)
Stable on current regimen. Continue current regimen. Continue to monitor. Call with any concerns.  

## 2017-11-29 NOTE — Assessment & Plan Note (Signed)
Will refer to GI. Call with any concerns. Continue to monitor. Call with any concerns.

## 2017-11-29 NOTE — Assessment & Plan Note (Signed)
Checking labs today. Await results. Call with any concerns. Will treat as needed.

## 2017-11-30 LAB — LIPID PANEL W/O CHOL/HDL RATIO
Cholesterol, Total: 166 mg/dL (ref 100–199)
HDL: 56 mg/dL (ref >39)
LDL Calculated: 93 mg/dL (ref 0–99)
Triglycerides: 84 mg/dL (ref 0–149)
VLDL Cholesterol Cal: 17 mg/dL (ref 5–40)

## 2017-11-30 LAB — CBC WITH DIFFERENTIAL/PLATELET
BASOS ABS: 0 10*3/uL (ref 0.0–0.2)
BASOS: 1 %
EOS (ABSOLUTE): 0.2 10*3/uL (ref 0.0–0.4)
Eos: 3 %
HEMATOCRIT: 39.2 % (ref 37.5–51.0)
Hemoglobin: 14.3 g/dL (ref 13.0–17.7)
Immature Grans (Abs): 0 10*3/uL (ref 0.0–0.1)
Immature Granulocytes: 0 %
LYMPHS ABS: 1.9 10*3/uL (ref 0.7–3.1)
Lymphs: 26 %
MCH: 34 pg — AB (ref 26.6–33.0)
MCHC: 36.5 g/dL — ABNORMAL HIGH (ref 31.5–35.7)
MCV: 93 fL (ref 79–97)
Monocytes Absolute: 0.7 10*3/uL (ref 0.1–0.9)
Monocytes: 10 %
NEUTROS ABS: 4.3 10*3/uL (ref 1.4–7.0)
Neutrophils: 60 %
Platelets: 210 10*3/uL (ref 150–450)
RBC: 4.21 x10E6/uL (ref 4.14–5.80)
RDW: 13.1 % (ref 12.3–15.4)
WBC: 7.2 10*3/uL (ref 3.4–10.8)

## 2017-11-30 LAB — COMPREHENSIVE METABOLIC PANEL
A/G RATIO: 2.3 — AB (ref 1.2–2.2)
ALT: 116 IU/L — AB (ref 0–44)
AST: 87 IU/L — AB (ref 0–40)
Albumin: 4.5 g/dL (ref 3.5–5.5)
Alkaline Phosphatase: 74 IU/L (ref 39–117)
BILIRUBIN TOTAL: 0.6 mg/dL (ref 0.0–1.2)
BUN/Creatinine Ratio: 18 (ref 9–20)
BUN: 15 mg/dL (ref 6–24)
CALCIUM: 8.8 mg/dL (ref 8.7–10.2)
CHLORIDE: 102 mmol/L (ref 96–106)
CO2: 25 mmol/L (ref 20–29)
Creatinine, Ser: 0.84 mg/dL (ref 0.76–1.27)
GFR calc non Af Amer: 96 mL/min/{1.73_m2} (ref >59)
GFR, EST AFRICAN AMERICAN: 111 mL/min/{1.73_m2} (ref >59)
GLOBULIN, TOTAL: 2 g/dL (ref 1.5–4.5)
Glucose: 100 mg/dL — ABNORMAL HIGH (ref 65–99)
POTASSIUM: 4.2 mmol/L (ref 3.5–5.2)
SODIUM: 140 mmol/L (ref 134–144)
Total Protein: 6.5 g/dL (ref 6.0–8.5)

## 2017-11-30 LAB — VITAMIN D 25 HYDROXY (VIT D DEFICIENCY, FRACTURES): Vit D, 25-Hydroxy: 17.8 ng/mL — ABNORMAL LOW (ref 30.0–100.0)

## 2017-11-30 LAB — RA QN+CCP(IGG/A)+SJOSSA+SJOSSB
Cyclic Citrullin Peptide Ab: 5 U (ref 0–19)
ENA SSA (RO) Ab: <0.2 AI (ref 0.0–0.9)
ENA SSB (LA) Ab: <0.2 AI (ref 0.0–0.9)
Rheumatoid fact SerPl-aCnc: <10 [IU]/mL (ref 0.0–13.9)

## 2017-11-30 LAB — PSA: PROSTATE SPECIFIC AG, SERUM: 0.7 ng/mL (ref 0.0–4.0)

## 2017-11-30 LAB — TSH: TSH: 2.83 u[IU]/mL (ref 0.450–4.500)

## 2017-11-30 LAB — ANA: Anti Nuclear Antibody(ANA): NEGATIVE

## 2017-11-30 LAB — URIC ACID: Uric Acid: 7.7 mg/dL (ref 3.7–8.6)

## 2017-11-30 LAB — CK: Total CK: 467 U/L — ABNORMAL HIGH (ref 24–204)

## 2017-11-30 LAB — SEDIMENTATION RATE: SED RATE: 7 mm/h (ref 0–30)

## 2017-12-01 ENCOUNTER — Encounter: Payer: Self-pay | Admitting: Family Medicine

## 2017-12-01 ENCOUNTER — Other Ambulatory Visit: Payer: Self-pay | Admitting: Family Medicine

## 2017-12-01 DIAGNOSIS — R748 Abnormal levels of other serum enzymes: Secondary | ICD-10-CM | POA: Insufficient documentation

## 2017-12-01 DIAGNOSIS — E559 Vitamin D deficiency, unspecified: Secondary | ICD-10-CM | POA: Insufficient documentation

## 2017-12-01 MED ORDER — VITAMIN D (ERGOCALCIFEROL) 1.25 MG (50000 UNIT) PO CAPS: 50000.0000 [IU] | ORAL_CAPSULE | ORAL | 0 refills | Status: DC

## 2017-12-08 ENCOUNTER — Encounter: Payer: Self-pay | Admitting: *Deleted

## 2017-12-22 ENCOUNTER — Other Ambulatory Visit: Payer: Self-pay | Admitting: Family Medicine

## 2017-12-23 NOTE — Telephone Encounter (Signed)
Requested Prescriptions  Pending Prescriptions Disp Refills  . levothyroxine (SYNTHROID, LEVOTHROID) 75 MCG tablet [Pharmacy Med Name: LEVOTHYROXINE 75 MCG TABLET] 30 tablet 0    Sig: TAKE 1 TABLET BY MOUTH EVERY DAY     Endocrinology:  Hypothyroid Agents Failed - 12/22/2017 10:33 AM      Failed - TSH needs to be rechecked within 3 months after an abnormal result. Refill until TSH is due.      Passed - TSH in normal range and within 360 days    TSH  Date Value Ref Range Status  11/28/2017 2.830 0.450 - 4.500 uIU/mL Final         Passed - Valid encounter within last 12 months    Recent Outpatient Visits          3 weeks ago Hypothyroidism, unspecified type   [place], [place], DO   1 year ago Routine general medical examination at a health care facility   [facility], [place], DO   1 year ago Acute right-sided low back pain with right-sided sciatica   [place], DO   1 year ago Acute right-sided low back pain with right-sided sciatica   [facility] Valerie Roys, DO      Future Appointments            In 3 days Wynetta Emery, Barb Merino, DO [facility]

## 2017-12-26 ENCOUNTER — Other Ambulatory Visit: Payer: Self-pay

## 2017-12-26 ENCOUNTER — Ambulatory Visit (INDEPENDENT_AMBULATORY_CARE_PROVIDER_SITE_OTHER): Payer: Managed Care, Other (non HMO) | Admitting: Family Medicine

## 2017-12-26 ENCOUNTER — Encounter: Payer: Self-pay | Admitting: Family Medicine

## 2017-12-26 VITALS — BP 155/96 | HR 66 | Temp 97.7°F | Ht 74.0 in | Wt 279.0 lb

## 2017-12-26 DIAGNOSIS — G629 Polyneuropathy, unspecified: Secondary | ICD-10-CM | POA: Diagnosis not present

## 2017-12-26 DIAGNOSIS — Z0000 Encounter for general adult medical examination without abnormal findings: Secondary | ICD-10-CM

## 2017-12-26 MED ORDER — NORTRIPTYLINE HCL 10 MG PO CAPS: 10.0000 mg | ORAL_CAPSULE | Freq: Every day | ORAL | 3 refills | Status: DC

## 2017-12-26 NOTE — Progress Notes (Signed)
BP (!) 155/96   Pulse 66   Temp 97.7 F (36.5 C) (Oral)   Ht 6' 2"  (1.88 m)   Wt 279 lb (126.6 kg)   SpO2 98%   BMI 35.82 kg/m    Subjective:    Patient ID: Nicholas Wall, male    DOB: February 15, 1955, 62 y.o.   MRN: [id number]  HPI: Nicholas Wall is a 62 y.o. male presenting on 12/26/2017 for comprehensive medical examination. Current medical complaints include: Caring for his Mom- having open heart surgery in about a week.   Interim Problems from his last visit: no  Depression Screen done today and results listed below:  Depression screen [facility] 2/9 12/26/2017 11/28/2017 04/19/2016 03/08/2016  Decreased Interest 1 1 0 0  Down, Depressed, Hopeless 0 0 0 0  PHQ - 2 Score 1 1 0 0  Altered sleeping 0 - - -  Tired, decreased energy 1 - - -  Change in appetite 1 - - -  Feeling bad or failure about yourself  0 - - -  Trouble concentrating 0 - - -  Moving slowly or fidgety/restless 0 - - -  Suicidal thoughts 0 - - -  PHQ-9 Score 3 - - -  Difficult doing work/chores Not difficult at all - - -   GAD 7 : Generalized Anxiety Score 12/26/2017  Nervous, Anxious, on Edge 1  Control/stop worrying 0  Worry too much - different things 1  Trouble relaxing 0  Restless 0  Easily annoyed or irritable 0  Afraid - awful might happen 0  Total GAD 7 Score 2  Anxiety Difficulty Not difficult at all    Past Medical History:  Past Medical History:  Diagnosis Date  . Allergic rhinitis   . GERD (gastroesophageal reflux disease)   . HTN (hypertension)   . Left inguinal hernia 07/2009    Surgical History:  Past Surgical History:  Procedure Laterality Date  . Garibaldi-- L4-L5 laminectomy  . HERNIA REPAIR Left 2011    Medications:  Current Outpatient Medications on File Prior to Visit  Medication Sig  . ibuprofen (ADVIL,MOTRIN) 800 MG tablet Take 1 tablet (800 mg total) by mouth every 8 (eight) hours as needed.  Marland Kitchen levothyroxine (SYNTHROID, LEVOTHROID) 75 MCG tablet TAKE 1 TABLET  BY MOUTH EVERY DAY  . omeprazole (PRILOSEC) 20 MG capsule TAKE 1 CAPSULE (20 MG TOTAL) BY MOUTH DAILY.  Marland Kitchen Vitamin D, Ergocalciferol, (DRISDOL) 1.25 MG (50000 UT) CAPS capsule Take 1 capsule (50,000 Units total) by mouth every 7 (seven) days.   No current facility-administered medications on file prior to visit.     Allergies:  No Known Allergies  Social History:  Social History   Socioeconomic History  . Marital status: Married    Spouse name: Not on file  . Number of children: Not on file  . Years of education: Not on file  . Highest education level: Not on file  Occupational History  . Not on file  Social Needs  . Financial resource strain: Not on file  . Food insecurity:    Worry: Not on file    Inability: Not on file  . Transportation needs:    Medical: Not on file    Non-medical: Not on file  Tobacco Use  . Smoking status: Never Smoker  . Smokeless tobacco: Never Used  Substance and Sexual Activity  . Alcohol use: Yes    Comment: daily  . Drug use: No  . Sexual  activity: Not on file  Lifestyle  . Physical activity:    Days per week: Not on file    Minutes per session: Not on file  . Stress: Not on file  Relationships  . Social connections:    Talks on phone: Not on file    Gets together: Not on file    Attends religious service: Not on file    Active member of club or organization: Not on file    Attends meetings of clubs or organizations: Not on file    Relationship status: Not on file  . Intimate partner violence:    Fear of current or ex partner: Not on file    Emotionally abused: Not on file    Physically abused: Not on file    Forced sexual activity: Not on file  Other Topics Concern  . Not on file  Social History Narrative  . Not on file   Social History   Tobacco Use  Smoking Status Never Smoker  Smokeless Tobacco Never Used   Social History   Substance and Sexual Activity  Alcohol Use Yes   Comment: daily    Family History:    Family History  Problem Relation Age of Onset  . Hypertension Mother   . Hyperlipidemia Mother   . Diabetes Mother   . Arthritis Mother   . Vision loss Mother   . Thyroid disease Mother   . Cancer Father   . Hyperlipidemia Father   . Arthritis Father   . Heart disease Father   . Thyroid disease Brother   . Hyperlipidemia Maternal Grandmother   . Hypertension Maternal Grandmother   . Alzheimer's disease Maternal Grandmother   . Heart disease Maternal Grandfather   . Hyperlipidemia Maternal Grandfather   . Hypertension Maternal Grandfather   . Cancer Paternal Grandmother   . Thyroid disease Brother     Past medical history, surgical history, medications, allergies, family history and social history reviewed with patient today and changes made to appropriate areas of the chart.   Review of Systems  Constitutional: Negative.   HENT: Negative.   Eyes: Negative.   Respiratory: Negative.   Cardiovascular: Negative.   Gastrointestinal: Negative.   Genitourinary: Negative.   Musculoskeletal: Negative.   Skin: Negative.   Neurological: Positive for tingling. Negative for dizziness, tremors, sensory change, speech change, focal weakness, seizures, loss of consciousness, weakness and headaches.  Endo/Heme/Allergies: Positive for environmental allergies. Negative for polydipsia. Does not bruise/bleed easily.  Psychiatric/Behavioral: Negative for depression, hallucinations, memory loss, substance abuse and suicidal ideas. The patient is nervous/anxious. The patient does not have insomnia.     All other ROS negative except what is listed above and in the HPI.      Objective:    BP (!) 155/96   Pulse 66   Temp 97.7 F (36.5 C) (Oral)   Ht 6' 2"  (1.88 m)   Wt 279 lb (126.6 kg)   SpO2 98%   BMI 35.82 kg/m   Wt Readings from Last 3 Encounters:  12/26/17 279 lb (126.6 kg)  11/28/17 280 lb 12.8 oz (127.4 kg)  04/19/16 263 lb (119.3 kg)    Physical Exam  Constitutional: He is  oriented to person, place, and time. He appears well-developed and well-nourished. No distress.  HENT:  Head: Normocephalic and atraumatic.  Right Ear: Hearing, tympanic membrane, external ear and ear canal normal.  Left Ear: Hearing, tympanic membrane, external ear and ear canal normal.  Nose: Nose normal.  Mouth/Throat: Uvula is  midline, oropharynx is clear and moist and mucous membranes are normal. No oropharyngeal exudate.  Eyes: Pupils are equal, round, and reactive to light. Conjunctivae, EOM and lids are normal. Right eye exhibits no discharge. Left eye exhibits no discharge. No scleral icterus.  Neck: Normal range of motion. Neck supple. No JVD present. No tracheal deviation present. No thyromegaly present.  Cardiovascular: Normal rate, regular rhythm, normal heart sounds and intact distal pulses. Exam reveals no gallop and no friction rub.  No murmur heard. Pulmonary/Chest: Effort normal and breath sounds normal. No stridor. No respiratory distress. He has no wheezes. He has no rales. He exhibits no tenderness.  Abdominal: Soft. Bowel sounds are normal. He exhibits no distension and no mass. There is no tenderness. There is no rebound and no guarding. No hernia.  Genitourinary:  Genitourinary Comments: Genital exam deferred with shared decision making  Musculoskeletal: Normal range of motion. He exhibits no edema, tenderness or deformity.  Lymphadenopathy:    He has no cervical adenopathy.  Neurological: He is alert and oriented to person, place, and time. He displays normal reflexes. No cranial nerve deficit or sensory deficit. He exhibits normal muscle tone. Coordination normal.  Skin: Skin is warm, dry and intact. Capillary refill takes less than 2 seconds. No rash noted. He is not diaphoretic. No erythema. No pallor.  Psychiatric: He has a normal mood and affect. His speech is normal and behavior is normal. Judgment and thought content normal. Cognition and memory are normal.    Nursing note and vitals reviewed.   Results for orders placed or performed in visit on 11/28/17  Bayer DCA Hb A1c Waived  Result Value Ref Range   HB A1C (BAYER DCA - WAIVED) 5.0 <7.0 %  CBC with Differential/Platelet  Result Value Ref Range   WBC 7.2 3.4 - 10.8 x10E3/uL   RBC 4.21 4.14 - 5.80 x10E6/uL   Hemoglobin 14.3 13.0 - 17.7 g/dL   Hematocrit 39.2 37.5 - 51.0 %   MCV 93 79 - 97 fL   MCH 34.0 (H) 26.6 - 33.0 pg   MCHC 36.5 (H) 31.5 - 35.7 g/dL   RDW 13.1 12.3 - 15.4 %   Platelets 210 150 - 450 x10E3/uL   Neutrophils 60 Not Estab. %   Lymphs 26 Not Estab. %   Monocytes 10 Not Estab. %   Eos 3 Not Estab. %   Basos 1 Not Estab. %   Neutrophils Absolute 4.3 1.4 - 7.0 x10E3/uL   Lymphocytes Absolute 1.9 0.7 - 3.1 x10E3/uL   Monocytes Absolute 0.7 0.1 - 0.9 x10E3/uL   EOS (ABSOLUTE) 0.2 0.0 - 0.4 x10E3/uL   Basophils Absolute 0.0 0.0 - 0.2 x10E3/uL   Immature Granulocytes 0 Not Estab. %   Immature Grans (Abs) 0.0 0.0 - 0.1 x10E3/uL   Hematology Comments: Note:   Comprehensive metabolic panel  Result Value Ref Range   Glucose 100 (H) 65 - 99 mg/dL   BUN 15 6 - 24 mg/dL   Creatinine, Ser 0.84 0.76 - 1.27 mg/dL   GFR calc non Af Amer 96 >59 mL/min/1.73   GFR calc Af Amer 111 >59 mL/min/1.73   BUN/Creatinine Ratio 18 9 - 20   Sodium 140 134 - 144 mmol/L   Potassium 4.2 3.5 - 5.2 mmol/L   Chloride 102 96 - 106 mmol/L   CO2 25 20 - 29 mmol/L   Calcium 8.8 8.7 - 10.2 mg/dL   Total Protein 6.5 6.0 - 8.5 g/dL   Albumin 4.5 3.5 -  5.5 g/dL   Globulin, Total 2.0 1.5 - 4.5 g/dL   Albumin/Globulin Ratio 2.3 (H) 1.2 - 2.2   Bilirubin Total 0.6 0.0 - 1.2 mg/dL   Alkaline Phosphatase 74 39 - 117 IU/L   AST 87 (H) 0 - 40 IU/L   ALT 116 (H) 0 - 44 IU/L  Lipid Panel w/o Chol/HDL Ratio  Result Value Ref Range   Cholesterol, Total 166 100 - 199 mg/dL   Triglycerides 84 0 - 149 mg/dL   HDL 56 >39 mg/dL   VLDL Cholesterol Cal 17 5 - 40 mg/dL   LDL Calculated 93 0 - 99 mg/dL  PSA   Result Value Ref Range   Prostate Specific Ag, Serum 0.7 0.0 - 4.0 ng/mL  TSH  Result Value Ref Range   TSH 2.830 0.450 - 4.500 uIU/mL  UA/M w/rflx Culture, Routine  Result Value Ref Range   Specific Gravity, UA 1.020 1.005 - 1.030   pH, UA 5.5 5.0 - 7.5   Color, UA Yellow Yellow   Appearance Ur Clear Clear   Leukocytes, UA Negative Negative   Protein, UA Negative Negative/Trace   Glucose, UA Negative Negative   Ketones, UA Negative Negative   RBC, UA Negative Negative   Bilirubin, UA Negative Negative   Urobilinogen, Ur 0.2 0.2 - 1.0 mg/dL   Nitrite, UA Negative Negative  RA Qn+CCP(IgG/A)+SjoSSA+SjoSSB  Result Value Ref Range   Rhuematoid fact SerPl-aCnc <10.0 0.0 - 13.9 IU/mL   ENA SSA (RO) Ab <0.2 0.0 - 0.9 AI   ENA SSB (LA) Ab <0.2 0.0 - 0.9 AI   Cyclic Citrullin Peptide Ab 5 0 - 19 units  Antinuclear Antib (ANA)  Result Value Ref Range   Anti Nuclear Antibody(ANA) Negative Negative  Sed Rate (ESR)  Result Value Ref Range   Sed Rate 7 0 - 30 mm/hr  CK (Creatine Kinase)  Result Value Ref Range   Total CK 467 (H) 24 - 204 U/L  VITAMIN D 25 Hydroxy (Vit-D Deficiency, Fractures)  Result Value Ref Range   Vit D, 25-Hydroxy 17.8 (L) 30.0 - 100.0 ng/mL  Uric acid  Result Value Ref Range   Uric Acid 7.7 3.7 - 8.6 mg/dL  Microalbumin, Urine Waived  Result Value Ref Range   Microalb, Ur Waived 30 (H) 0 - 19 mg/L   Creatinine, Urine Waived 200 10 - 300 mg/dL   Microalb/Creat Ratio <30 <30 mg/g      Assessment & Plan:   Problem List Items Addressed This Visit      Nervous and Auditory   Neuropathy    Will start nortriptyline and recheck 3 months. Call with any concerns.        Other Visit Diagnoses    Routine general medical examination at a health care facility    -  Primary   Vaccines up to date. Screening labs checked last visit and normal. Colonscopy ordered. Continue diet and exercise. Call with any concerns.        Discussed aspirin prophylaxis for  myocardial infarction prevention and decision was it was not indicated  LABORATORY TESTING:  Health maintenance labs ordered today as discussed above.   IMMUNIZATIONS:   - Tdap: Tetanus vaccination status reviewed: last tetanus booster within 10 years. - Influenza: Refused - Pneumovax: Not applicable  SCREENING: - Colonoscopy: Ordered today  Discussed with patient purpose of the colonoscopy is to detect colon cancer at curable precancerous or early stages   PATIENT COUNSELING:    Sexuality: Discussed sexually  transmitted diseases, partner selection, use of condoms, avoidance of unintended pregnancy  and contraceptive alternatives.   Advised to avoid cigarette smoking.  I discussed with the patient that most people either abstain from alcohol or drink within safe limits (<=14/week and <=4 drinks/occasion for males, <=7/weeks and <= 3 drinks/occasion for females) and that the risk for alcohol disorders and other health effects rises proportionally with the number of drinks per week and how often a drinker exceeds daily limits.  Discussed cessation/primary prevention of drug use and availability of treatment for abuse.   Diet: Encouraged to adjust caloric intake to maintain  or achieve ideal body weight, to reduce intake of dietary saturated fat and total fat, to limit sodium intake by avoiding high sodium foods and not adding table salt, and to maintain adequate dietary potassium and calcium preferably from fresh fruits, vegetables, and low-fat dairy products.    stressed the importance of regular exercise  Injury prevention: Discussed safety belts, safety helmets, smoke detector, smoking near bedding or upholstery.   Dental health: Discussed importance of regular tooth brushing, flossing, and dental visits.   Follow up plan: NEXT PREVENTATIVE PHYSICAL DUE IN 1 YEAR. Return in about 3 months (around 03/27/2018) for follow up parethesias.

## 2017-12-26 NOTE — Assessment & Plan Note (Signed)
Will start nortriptyline and recheck 3 months. Call with any concerns.

## 2018-01-20 ENCOUNTER — Other Ambulatory Visit: Payer: Self-pay | Admitting: Family Medicine

## 2018-01-20 NOTE — Telephone Encounter (Signed)
Requested Prescriptions  Pending Prescriptions Disp Refills  . nortriptyline (PAMELOR) 10 MG capsule [Pharmacy Med Name: NORTRIPTYLINE HCL 10 MG CAP] 60 capsule 2    Sig: TAKE 1 CAPSULE (10 MG TOTAL) BY MOUTH AT BEDTIME.     Psychiatry:  Antidepressants - Heterocyclics (TCAs) Passed - 01/20/2018  1:36 PM      Passed - Valid encounter within last 6 months    Recent Outpatient Visits          3 weeks ago Routine general medical examination at a health care facility   [facility], [place], DO   1 month ago Hypothyroidism, unspecified type   [place], [place], DO   1 year ago Routine general medical examination at a health care facility   [facility], [place], DO   1 year ago Acute right-sided low back pain with right-sided sciatica   [place], [place] P, DO   1 year ago Acute right-sided low back pain with right-sided sciatica   [facility] Valerie Roys, DO      Future Appointments            In 2 months Wynetta Emery, Barb Merino, DO [facility]

## 2018-02-15 ENCOUNTER — Other Ambulatory Visit: Payer: Self-pay | Admitting: Family Medicine

## 2018-02-24 ENCOUNTER — Other Ambulatory Visit: Payer: Self-pay | Admitting: Family Medicine

## 2018-02-24 NOTE — Telephone Encounter (Signed)
Requested Prescriptions  Pending Prescriptions Disp Refills  . omeprazole (PRILOSEC) 20 MG capsule [Pharmacy Med Name: OMEPRAZOLE DR 20 MG CAPSULE] 90 capsule 0    Sig: TAKE 1 CAPSULE (20 MG TOTAL) BY MOUTH DAILY.     Gastroenterology: Proton Pump Inhibitors Passed - 02/24/2018  2:02 AM      Passed - Valid encounter within last 12 months    Recent Outpatient Visits          2 months ago Routine general medical examination at a health care facility   [facility], [place], DO   2 months ago Hypothyroidism, unspecified type   [place], [place], DO   1 year ago Routine general medical examination at a health care facility   [facility], [place], DO   1 year ago Acute right-sided low back pain with right-sided sciatica   [place], DO   1 year ago Acute right-sided low back pain with right-sided sciatica   [facility] Valerie Roys, DO      Future Appointments            In 1 month [place], Barb Merino, DO [facility], [facility]

## 2018-03-11 ENCOUNTER — Other Ambulatory Visit: Payer: Self-pay | Admitting: Family Medicine

## 2018-03-11 NOTE — Telephone Encounter (Signed)
Requested medication (s) are due for refill today: yes  Requested medication (s) are on the active medication list: yes    Last refill: 12/01/2017  #12  0 refills  Future visit scheduled yes 03/27/2018  Dr. Wynetta Emery  Notes to clinic:not delegated  Requested Prescriptions  Pending Prescriptions Disp Refills   Vitamin D, Ergocalciferol, (DRISDOL) 1.25 MG (50000 UT) CAPS capsule [Pharmacy Med Name: VITAMIN D2 1.25MG (50,000 UNIT)] 12 capsule 0    Sig: Take 1 capsule (50,000 Units total) by mouth every 7 (seven) days.     Endocrinology:  Vitamins - Vitamin D Supplementation Failed - 03/11/2018  4:17 PM      Failed - 50,000 IU strengths are not delegated      Failed - Phosphate in normal range and within 360 days    No results found for: PHOS       Failed - Vitamin D in normal range and within 360 days    Vit D, 25-Hydroxy  Date Value Ref Range Status  11/28/2017 17.8 (L) 30.0 - 100.0 ng/mL Final    Comment:    Vitamin D deficiency has been defined by the Institute of Medicine and an Endocrine Society practice guideline as a level of serum 25-OH vitamin D less than 20 ng/mL (1,2). The Endocrine Society went on to further define vitamin D insufficiency as a level between 21 and 29 ng/mL (2). 1. IOM (Institute of Medicine). 2010. Dietary reference    intakes for calcium and D. [place]: The    [employer]. 2. Holick MF, [place], Bischoff-Ferrari HA, et al.    Evaluation, treatment, and prevention of vitamin D    deficiency: an Endocrine Society clinical practice    guideline. JCEM. 2011 Jul; 96(7):1911-30.          Passed - Ca in normal range and within 360 days    Calcium  Date Value Ref Range Status  11/28/2017 8.8 8.7 - 10.2 mg/dL Final         Passed - Valid encounter within last 12 months    Recent Outpatient Visits          2 months ago Routine general medical examination at a health care facility   [facility], [place], DO   3  months ago Hypothyroidism, unspecified type   [place], [place], DO   1 year ago Routine general medical examination at a health care facility   [facility], [place], DO   1 year ago Acute right-sided low back pain with right-sided sciatica   [place], DO   2 years ago Acute right-sided low back pain with right-sided sciatica   [facility] Valerie Roys, DO      Future Appointments            In 2 weeks Wynetta Emery, Barb Merino, DO [facility], [facility]

## 2018-03-27 ENCOUNTER — Encounter: Payer: Self-pay | Admitting: Family Medicine

## 2018-03-27 ENCOUNTER — Ambulatory Visit (INDEPENDENT_AMBULATORY_CARE_PROVIDER_SITE_OTHER): Payer: Managed Care, Other (non HMO) | Admitting: Family Medicine

## 2018-03-27 ENCOUNTER — Other Ambulatory Visit: Payer: Self-pay

## 2018-03-27 VITALS — BP 144/93 | HR 68 | Temp 98.5°F | Ht 74.0 in | Wt 271.0 lb

## 2018-03-27 DIAGNOSIS — B353 Tinea pedis: Secondary | ICD-10-CM | POA: Diagnosis not present

## 2018-03-27 DIAGNOSIS — G5603 Carpal tunnel syndrome, bilateral upper limbs: Secondary | ICD-10-CM | POA: Diagnosis not present

## 2018-03-27 DIAGNOSIS — G629 Polyneuropathy, unspecified: Secondary | ICD-10-CM

## 2018-03-27 DIAGNOSIS — M65332 Trigger finger, left middle finger: Secondary | ICD-10-CM | POA: Diagnosis not present

## 2018-03-27 DIAGNOSIS — E559 Vitamin D deficiency, unspecified: Secondary | ICD-10-CM

## 2018-03-27 NOTE — Assessment & Plan Note (Signed)
Worsening. Will get him into hand surgeon. Call with any concerns.

## 2018-03-27 NOTE — Assessment & Plan Note (Signed)
Rechecking levels today. Treat as needed. Call with any concerns.  

## 2018-03-27 NOTE — Progress Notes (Signed)
BP (!) 144/93   Pulse 68   Temp 98.5 F (36.9 C) (Oral)   Ht 6' 2"  (1.88 m)   Wt 271 lb (122.9 kg)   SpO2 98%   BMI 34.79 kg/m    Subjective:    Patient ID: Nicholas Wall, male    DOB: [birth year], 63 y.o.   MRN: [id number]  HPI: Nicholas Wall is a 63 y.o. male  Chief Complaint  Patient presents with  . Follow-up    28mf/u   NEUROPATHY- not particularly better with the nortriptyline, not taking it every night.  Neuropathy status: worse  Satisfied with current treatment?: no Medication side effects: no Medication compliance:  fair compliance Location: bilateral hands in the first 3 fingers Pain: no Severity: mild  Quality:  Numb and tingling Frequency: worse with activity, comes on several times a day Bilateral: yes R>L Symmetric: no Numbness: yes Decreased sensation: no Weakness: yes Context: worse with work and [facility] Relevant past medical, surgical, family and social history reviewed and updated as indicated. Interim medical history since our last visit reviewed. Allergies and medications reviewed and updated.  Review of Systems  Constitutional: Negative.   Respiratory: Negative.   Cardiovascular: Negative.   Musculoskeletal: Negative.   Skin: Positive for rash. Negative for color change, pallor and wound.  Neurological: Positive for weakness and numbness. Negative for dizziness, tremors, seizures, syncope, facial asymmetry, speech difficulty, light-headedness and headaches.  Psychiatric/Behavioral: Negative.     Per HPI unless specifically indicated above     Objective:    BP (!) 144/93   Pulse 68   Temp 98.5 F (36.9 C) (Oral)   Ht 6' 2"  (1.88 m)   Wt 271 lb (122.9 kg)   SpO2 98%   BMI 34.79 kg/m   Wt Readings from Last 3 Encounters:  03/27/18 271 lb (122.9 kg)  12/26/17 279 lb (126.6 kg)  11/28/17 280 lb 12.8 oz (127.4 kg)    Physical Exam Vitals signs and nursing note reviewed.  Constitutional:      General: He is  not in acute distress.    Appearance: Normal appearance. He is not ill-appearing, toxic-appearing or diaphoretic.  HENT:     Head: Normocephalic and atraumatic.     Right Ear: External ear normal.     Left Ear: External ear normal.     Nose: Nose normal.     Mouth/Throat:     Mouth: Mucous membranes are moist.     Pharynx: Oropharynx is clear.  Eyes:     General: No scleral icterus.       Right eye: No discharge.        Left eye: No discharge.     Extraocular Movements: Extraocular movements intact.     Conjunctiva/sclera: Conjunctivae normal.     Pupils: Pupils are equal, round, and reactive to light.  Neck:     Musculoskeletal: Normal range of motion and neck supple.  Cardiovascular:     Rate and Rhythm: Normal rate and regular rhythm.     Pulses: Normal pulses.     Heart sounds: Normal heart sounds. No murmur. No friction rub. No gallop.   Pulmonary:     Effort: Pulmonary effort is normal. No respiratory distress.     Breath sounds: Normal breath sounds. No stridor. No wheezing, rhonchi or rales.  Chest:     Chest wall: No tenderness.  Musculoskeletal: Normal range of motion.  Skin:    General: Skin is warm and  dry.     Capillary Refill: Capillary refill takes less than 2 seconds.     Coloration: Skin is not jaundiced or pale.     Findings: No bruising, erythema, lesion or rash.  Neurological:     General: No focal deficit present.     Mental Status: He is alert and oriented to person, place, and time. Mental status is at baseline.  Psychiatric:        Mood and Affect: Mood normal.        Behavior: Behavior normal.        Thought Content: Thought content normal.        Judgment: Judgment normal.     Results for orders placed or performed in visit on 11/28/17  Bayer DCA Hb A1c Waived  Result Value Ref Range   HB A1C (BAYER DCA - WAIVED) 5.0 <7.0 %  CBC with Differential/Platelet  Result Value Ref Range   WBC 7.2 3.4 - 10.8 x10E3/uL   RBC 4.21 4.14 - 5.80 x10E6/uL    Hemoglobin 14.3 13.0 - 17.7 g/dL   Hematocrit 39.2 37.5 - 51.0 %   MCV 93 79 - 97 fL   MCH 34.0 (H) 26.6 - 33.0 pg   MCHC 36.5 (H) 31.5 - 35.7 g/dL   RDW 13.1 12.3 - 15.4 %   Platelets 210 150 - 450 x10E3/uL   Neutrophils 60 Not Estab. %   Lymphs 26 Not Estab. %   Monocytes 10 Not Estab. %   Eos 3 Not Estab. %   Basos 1 Not Estab. %   Neutrophils Absolute 4.3 1.4 - 7.0 x10E3/uL   Lymphocytes Absolute 1.9 0.7 - 3.1 x10E3/uL   Monocytes Absolute 0.7 0.1 - 0.9 x10E3/uL   EOS (ABSOLUTE) 0.2 0.0 - 0.4 x10E3/uL   Basophils Absolute 0.0 0.0 - 0.2 x10E3/uL   Immature Granulocytes 0 Not Estab. %   Immature Grans (Abs) 0.0 0.0 - 0.1 x10E3/uL   Hematology Comments: Note:   Comprehensive metabolic panel  Result Value Ref Range   Glucose 100 (H) 65 - 99 mg/dL   BUN 15 6 - 24 mg/dL   Creatinine, Ser 0.84 0.76 - 1.27 mg/dL   GFR calc non Af Amer 96 >59 mL/min/1.73   GFR calc Af Amer 111 >59 mL/min/1.73   BUN/Creatinine Ratio 18 9 - 20   Sodium 140 134 - 144 mmol/L   Potassium 4.2 3.5 - 5.2 mmol/L   Chloride 102 96 - 106 mmol/L   CO2 25 20 - 29 mmol/L   Calcium 8.8 8.7 - 10.2 mg/dL   Total Protein 6.5 6.0 - 8.5 g/dL   Albumin 4.5 3.5 - 5.5 g/dL   Globulin, Total 2.0 1.5 - 4.5 g/dL   Albumin/Globulin Ratio 2.3 (H) 1.2 - 2.2   Bilirubin Total 0.6 0.0 - 1.2 mg/dL   Alkaline Phosphatase 74 39 - 117 IU/L   AST 87 (H) 0 - 40 IU/L   ALT 116 (H) 0 - 44 IU/L  Lipid Panel w/o Chol/HDL Ratio  Result Value Ref Range   Cholesterol, Total 166 100 - 199 mg/dL   Triglycerides 84 0 - 149 mg/dL   HDL 56 >39 mg/dL   VLDL Cholesterol Cal 17 5 - 40 mg/dL   LDL Calculated 93 0 - 99 mg/dL  PSA  Result Value Ref Range   Prostate Specific Ag, Serum 0.7 0.0 - 4.0 ng/mL  TSH  Result Value Ref Range   TSH 2.830 0.450 - 4.500 uIU/mL  UA/M w/rflx  Culture, Routine  Result Value Ref Range   Specific Gravity, UA 1.020 1.005 - 1.030   pH, UA 5.5 5.0 - 7.5   Color, UA Yellow Yellow   Appearance Ur Clear  Clear   Leukocytes, UA Negative Negative   Protein, UA Negative Negative/Trace   Glucose, UA Negative Negative   Ketones, UA Negative Negative   RBC, UA Negative Negative   Bilirubin, UA Negative Negative   Urobilinogen, Ur 0.2 0.2 - 1.0 mg/dL   Nitrite, UA Negative Negative  RA Qn+CCP(IgG/A)+SjoSSA+SjoSSB  Result Value Ref Range   Rhuematoid fact SerPl-aCnc <10.0 0.0 - 13.9 IU/mL   ENA SSA (RO) Ab <0.2 0.0 - 0.9 AI   ENA SSB (LA) Ab <0.2 0.0 - 0.9 AI   Cyclic Citrullin Peptide Ab 5 0 - 19 units  Antinuclear Antib (ANA)  Result Value Ref Range   Anti Nuclear Antibody(ANA) Negative Negative  Sed Rate (ESR)  Result Value Ref Range   Sed Rate 7 0 - 30 mm/hr  CK (Creatine Kinase)  Result Value Ref Range   Total CK 467 (H) 24 - 204 U/L  VITAMIN D 25 Hydroxy (Vit-D Deficiency, Fractures)  Result Value Ref Range   Vit D, 25-Hydroxy 17.8 (L) 30.0 - 100.0 ng/mL  Uric acid  Result Value Ref Range   Uric Acid 7.7 3.7 - 8.6 mg/dL  Microalbumin, Urine Waived  Result Value Ref Range   Microalb, Ur Waived 30 (H) 0 - 19 mg/L   Creatinine, Urine Waived 200 10 - 300 mg/dL   Microalb/Creat Ratio <30 <30 mg/g      Assessment & Plan:   Problem List Items Addressed This Visit      Nervous and Auditory   Neuropathy - Primary    Unclear if this is due to neuropathy or due to his tinea pedis. No better with nortriptyline. Will stop it. Will get him into see dermatology for further evaluation. Call with any concerns.       Bilateral carpal tunnel syndrome    Worsening. Will get him into hand surgeon. Call with any concerns.       Relevant Orders   Ambulatory referral to Hand Surgery     Musculoskeletal and Integument   Trigger middle finger of left hand    Worsening. Will get him into hand surgeon. Call with any concerns.       Relevant Orders   Ambulatory referral to Hand Surgery     Other   Vitamin D deficiency    Rechecking levels today. Treat as needed. Call with any  concerns.       Relevant Orders   VITAMIN D 25 Hydroxy (Vit-D Deficiency, Fractures)    Other Visit Diagnoses    Tinea pedis of both feet       No better with creams. Podiatry not helpful. Will get him into dermatology. Call with any concerns.    Relevant Orders   Ambulatory referral to Dermatology       Follow up plan: Return December, for physical.

## 2018-03-27 NOTE — Assessment & Plan Note (Signed)
Unclear if this is due to neuropathy or due to his tinea pedis. No better with nortriptyline. Will stop it. Will get him into see dermatology for further evaluation. Call with any concerns.

## 2018-03-28 LAB — VITAMIN D 25 HYDROXY (VIT D DEFICIENCY, FRACTURES): Vit D, 25-Hydroxy: 19.1 ng/mL — ABNORMAL LOW (ref 30.0–100.0)

## 2018-03-30 ENCOUNTER — Other Ambulatory Visit: Payer: Self-pay | Admitting: Family Medicine

## 2018-03-30 MED ORDER — VITAMIN D (ERGOCALCIFEROL) 1.25 MG (50000 UNIT) PO CAPS: 50000.0000 [IU] | ORAL_CAPSULE | ORAL | 0 refills | Status: DC

## 2018-05-27 ENCOUNTER — Other Ambulatory Visit: Payer: Self-pay | Admitting: Family Medicine

## 2018-11-22 ENCOUNTER — Other Ambulatory Visit: Payer: Self-pay | Admitting: Family Medicine

## 2018-12-11 ENCOUNTER — Other Ambulatory Visit: Payer: Self-pay | Admitting: Family Medicine

## 2019-01-01 ENCOUNTER — Other Ambulatory Visit: Payer: Self-pay

## 2019-01-01 ENCOUNTER — Ambulatory Visit (INDEPENDENT_AMBULATORY_CARE_PROVIDER_SITE_OTHER): Payer: Managed Care, Other (non HMO) | Admitting: Family Medicine

## 2019-01-01 ENCOUNTER — Encounter: Payer: Self-pay | Admitting: Family Medicine

## 2019-01-01 VITALS — BP 151/102 | HR 76 | Temp 98.5°F | Ht 73.94 in | Wt 274.2 lb

## 2019-01-01 DIAGNOSIS — E039 Hypothyroidism, unspecified: Secondary | ICD-10-CM

## 2019-01-01 DIAGNOSIS — Z1211 Encounter for screening for malignant neoplasm of colon: Secondary | ICD-10-CM

## 2019-01-01 DIAGNOSIS — Z0000 Encounter for general adult medical examination without abnormal findings: Secondary | ICD-10-CM

## 2019-01-01 DIAGNOSIS — E559 Vitamin D deficiency, unspecified: Secondary | ICD-10-CM | POA: Diagnosis not present

## 2019-01-01 DIAGNOSIS — R030 Elevated blood-pressure reading, without diagnosis of hypertension: Secondary | ICD-10-CM

## 2019-01-01 LAB — MICROALBUMIN, URINE WAIVED
Creatinine, Urine Waived: 300 mg/dL (ref 10–300)
Microalb, Ur Waived: 30 mg/L — ABNORMAL HIGH (ref 0–19)
Microalb/Creat Ratio: <30 mg/g (ref <30)

## 2019-01-01 MED ORDER — OMEPRAZOLE 20 MG PO CPDR: DELAYED_RELEASE_CAPSULE | ORAL | 3 refills | Status: DC

## 2019-01-01 NOTE — Patient Instructions (Signed)

## 2019-01-01 NOTE — Progress Notes (Signed)
BP (!) 151/102   Pulse 76   Temp 98.5 F (36.9 C)   Ht 6' 1.94" (1.878 m)   Wt 274 lb 4 oz (124.4 kg)   SpO2 98%   BMI 35.27 kg/m    Subjective:    Patient ID: Nicholas Wall, male    DOB: [birth year], 63 y.o.   MRN: [id number]  HPI: Nicholas Wall is a 63 y.o. male presenting on 01/01/2019 for comprehensive medical examination. Current medical complaints include:  ELEVATED BLOOD PRESSURE Duration of elevated BP: unknown BP monitoring frequency: every couple of months BP range: has been creeping up 140s/90s Previous BP meds: yes- years ago, lost weight and it went down Recent stressors: yes Family history of hypertension: yes Recurrent headaches: no Visual changes: no Palpitations: no  Dyspnea: no Chest pain: no Lower extremity edema: no Dizzy/lightheaded: no Transient ischemic attacks: no  HYPOTHYROIDISM Thyroid control status:stable Satisfied with current treatment? yes Medication side effects: no Medication compliance: excellent compliance Etiology of hypothyroidism:  Recent dose adjustment:no Fatigue: yes Cold intolerance: no Heat intolerance: no Weight gain: yes Weight loss: no Constipation: no Diarrhea/loose stools: no Palpitations: no Lower extremity edema: no Anxiety/depressed mood: no  Interim Problems from his last visit: no  Depression Screen done today and results listed below:  Depression screen [facility] 2/9 01/01/2019 12/26/2017 11/28/2017 04/19/2016 03/08/2016  Decreased Interest 0 1 1 0 0  Down, Depressed, Hopeless 0 0 0 0 0  PHQ - 2 Score 0 1 1 0 0  Altered sleeping - 0 - - -  Tired, decreased energy - 1 - - -  Change in appetite - 1 - - -  Feeling bad or failure about yourself  - 0 - - -  Trouble concentrating - 0 - - -  Moving slowly or fidgety/restless - 0 - - -  Suicidal thoughts - 0 - - -  PHQ-9 Score - 3 - - -  Difficult doing work/chores - Not difficult at all - - -    Past Medical History:  Past Medical History:  Diagnosis Date    . Allergic rhinitis   . GERD (gastroesophageal reflux disease)   . HTN (hypertension)   . Left inguinal hernia 07/2009    Surgical History:  Past Surgical History:  Procedure Laterality Date  . [place]-- L4-L5 laminectomy  . HERNIA REPAIR Left 2011    Medications:  Current Outpatient Medications on File Prior to Visit  Medication Sig  . ibuprofen (ADVIL,MOTRIN) 800 MG tablet Take 1 tablet (800 mg total) by mouth every 8 (eight) hours as needed.  Marland Kitchen levothyroxine (SYNTHROID) 75 MCG tablet TAKE 1 TABLET BY MOUTH EVERY DAY  . loratadine (CLARITIN) 10 MG tablet Take 1 tablet (10 mg total) by mouth daily.   No current facility-administered medications on file prior to visit.    Allergies:  No Known Allergies  Social History:  Social History   Socioeconomic History  . Marital status: Married    Spouse name: Not on file  . Number of children: Not on file  . Years of education: Not on file  . Highest education level: Not on file  Occupational History  . Not on file  Tobacco Use  . Smoking status: Never Smoker  . Smokeless tobacco: Never Used  Substance and Sexual Activity  . Alcohol use: Yes    Comment: daily  . Drug use: No  . Sexual activity: Not on file  Other Topics Concern  . Not  on file  Social History Narrative  . Not on file   Social Determinants of Health   Financial Resource Strain:   . Difficulty of Paying Living Expenses: Not on file  Food Insecurity:   . Worried About Charity fundraiser in the Last Year: Not on file  . Ran Out of Food in the Last Year: Not on file  Transportation Needs:   . Lack of Transportation (Medical): Not on file  . Lack of Transportation (Non-Medical): Not on file  Physical Activity:   . Days of Exercise per Week: Not on file  . Minutes of Exercise per Session: Not on file  Stress:   . Feeling of Stress : Not on file  Social Connections:   . Frequency of Communication with Friends and Family: Not on file   . Frequency of Social Gatherings with Friends and Family: Not on file  . Attends Religious Services: Not on file  . Active Member of Clubs or Organizations: Not on file  . Attends Archivist Meetings: Not on file  . Marital Status: Not on file  Intimate Partner Violence:   . Fear of Current or Ex-Partner: Not on file  . Emotionally Abused: Not on file  . Physically Abused: Not on file  . Sexually Abused: Not on file   Social History   Tobacco Use  Smoking Status Never Smoker  Smokeless Tobacco Never Used   Social History   Substance and Sexual Activity  Alcohol Use Yes   Comment: daily    Family History:  Family History  Problem Relation Age of Onset  . Hypertension Mother   . Hyperlipidemia Mother   . Diabetes Mother   . Arthritis Mother   . Vision loss Mother   . Thyroid disease Mother   . Cancer Father   . Hyperlipidemia Father   . Arthritis Father   . Heart disease Father   . Thyroid disease Brother   . Hyperlipidemia Maternal Grandmother   . Hypertension Maternal Grandmother   . Alzheimer's disease Maternal Grandmother   . Heart disease Maternal Grandfather   . Hyperlipidemia Maternal Grandfather   . Hypertension Maternal Grandfather   . Cancer Paternal Grandmother   . Thyroid disease Brother     Past medical history, surgical history, medications, allergies, family history and social history reviewed with patient today and changes made to appropriate areas of the chart.   Review of Systems  Constitutional: Positive for diaphoresis. Negative for chills, fever, malaise/fatigue and weight loss.  HENT: Positive for hearing loss. Negative for congestion, ear discharge, ear pain, nosebleeds, sinus pain, sore throat and tinnitus.   Eyes: Negative.   Respiratory: Negative.  Negative for stridor.        + stridor   Cardiovascular: Negative.   Gastrointestinal: Negative.   Genitourinary: Negative.   Musculoskeletal: Positive for myalgias. Negative  for back pain, falls, joint pain and neck pain.  Skin: Negative.   Neurological: Positive for tingling. Negative for dizziness, tremors, sensory change, speech change, focal weakness, seizures, loss of consciousness, weakness and headaches.  Endo/Heme/Allergies: Negative.   Psychiatric/Behavioral: Negative.     All other ROS negative except what is listed above and in the HPI.      Objective:    BP (!) 151/102   Pulse 76   Temp 98.5 F (36.9 C)   Ht 6' 1.94" (1.878 m)   Wt 274 lb 4 oz (124.4 kg)   SpO2 98%   BMI 35.27 kg/m  Wt Readings from Last 3 Encounters:  01/01/19 274 lb 4 oz (124.4 kg)  03/27/18 271 lb (122.9 kg)  12/26/17 279 lb (126.6 kg)    Physical Exam Vitals and nursing note reviewed.  Constitutional:      General: He is not in acute distress.    Appearance: Normal appearance. He is obese. He is not ill-appearing, toxic-appearing or diaphoretic.  HENT:     Head: Normocephalic and atraumatic.     Right Ear: Tympanic membrane, ear canal and external ear normal. There is no impacted cerumen.     Left Ear: Tympanic membrane, ear canal and external ear normal. There is no impacted cerumen.     Nose: Nose normal. No congestion or rhinorrhea.     Mouth/Throat:     Mouth: Mucous membranes are moist.     Pharynx: Oropharynx is clear. No oropharyngeal exudate or posterior oropharyngeal erythema.  Eyes:     General: No scleral icterus.       Right eye: No discharge.        Left eye: No discharge.     Extraocular Movements: Extraocular movements intact.     Conjunctiva/sclera: Conjunctivae normal.     Pupils: Pupils are equal, round, and reactive to light.  Neck:     Vascular: No carotid bruit.  Cardiovascular:     Rate and Rhythm: Normal rate and regular rhythm.     Pulses: Normal pulses.     Heart sounds: No murmur. No friction rub. No gallop.   Pulmonary:     Effort: Pulmonary effort is normal. No respiratory distress.     Breath sounds: Normal breath sounds.  No stridor. No wheezing, rhonchi or rales.  Chest:     Chest wall: No tenderness.  Abdominal:     General: Abdomen is flat. Bowel sounds are normal. There is no distension.     Palpations: Abdomen is soft. There is no mass.     Tenderness: There is no abdominal tenderness. There is no right CVA tenderness, left CVA tenderness, guarding or rebound.     Hernia: No hernia is present.  Genitourinary:    Comments: Genital exam deferred with shared decision making Musculoskeletal:        General: No swelling, tenderness, deformity or signs of injury.     Cervical back: Normal range of motion and neck supple. No rigidity. No muscular tenderness.     Right lower leg: No edema.     Left lower leg: No edema.  Lymphadenopathy:     Cervical: No cervical adenopathy.  Skin:    General: Skin is warm and dry.     Capillary Refill: Capillary refill takes less than 2 seconds.     Coloration: Skin is not jaundiced or pale.     Findings: No bruising, erythema, lesion or rash.  Neurological:     General: No focal deficit present.     Mental Status: He is alert and oriented to person, place, and time.     Cranial Nerves: No cranial nerve deficit.     Sensory: No sensory deficit.     Motor: No weakness.     Coordination: Coordination normal.     Gait: Gait normal.     Deep Tendon Reflexes: Reflexes normal.  Psychiatric:        Mood and Affect: Mood normal.        Behavior: Behavior normal.        Thought Content: Thought content normal.  Judgment: Judgment normal.     Results for orders placed or performed in visit on 03/27/18  VITAMIN D 25 Hydroxy (Vit-D Deficiency, Fractures)  Result Value Ref Range   Vit D, 25-Hydroxy 19.1 (L) 30.0 - 100.0 ng/mL      Assessment & Plan:   Problem List Items Addressed This Visit      Endocrine   Hypothyroidism    Rechecking levels today. Await results. Treat as needed.       Relevant Orders   TSH     Other   Vitamin D deficiency     Rechecking levels today. Await results. Treat as needed.       Relevant Orders   Vit D  25 hydroxy (rtn osteoporosis monitoring)    Other Visit Diagnoses    Routine general medical examination at a health care facility    -  Primary   Vaccines up to date. Screening labs checked today. Colonoscopy ordered today. Continue diet and exercise. Call with any concerns.   Relevant Orders   CBC with Differential OUT   Comp Met (CMET)   Lipid Panel w/o Chol/HDL Ratio OUT   PSA   TSH   Vit D  25 hydroxy (rtn osteoporosis monitoring)   Elevated BP without diagnosis of hypertension       Will work on [employer] and exercise. Recheck 2 months. Call with any concerns.    Relevant Orders   Microalbumin, Urine Waived   Screening for colon cancer       Ordered today.    Relevant Orders   Ambulatory referral to Gastroenterology      Discussed aspirin prophylaxis for myocardial infarction prevention and decision was made to start ASA  LABORATORY TESTING:  Health maintenance labs ordered today as discussed above.   The natural history of prostate cancer and ongoing controversy regarding screening and potential treatment outcomes of prostate cancer has been discussed with the patient. The meaning of a false positive PSA and a false negative PSA has been discussed. He indicates understanding of the limitations of this screening test and wishes to proceed with screening PSA testing.   IMMUNIZATIONS:   - Tdap: Tetanus vaccination status reviewed: last tetanus booster within 10 years. - Influenza: Refused - Pneumovax: Not applicable   SCREENING: - Colonoscopy: Ordered today  Discussed with patient purpose of the colonoscopy is to detect colon cancer at curable precancerous or early stages   PATIENT COUNSELING:    Sexuality: Discussed sexually transmitted diseases, partner selection, use of condoms, avoidance of unintended pregnancy  and contraceptive alternatives.   Advised to avoid cigarette  smoking.  I discussed with the patient that most people either abstain from alcohol or drink within safe limits (<=14/week and <=4 drinks/occasion for males, <=7/weeks and <= 3 drinks/occasion for females) and that the risk for alcohol disorders and other health effects rises proportionally with the number of drinks per week and how often a drinker exceeds daily limits.  Discussed cessation/primary prevention of drug use and availability of treatment for abuse.   Diet: Encouraged to adjust caloric intake to maintain  or achieve ideal body weight, to reduce intake of dietary saturated fat and total fat, to limit sodium intake by avoiding high sodium foods and not adding table salt, and to maintain adequate dietary potassium and calcium preferably from fresh fruits, vegetables, and low-fat dairy products.    stressed the importance of regular exercise  Injury prevention: Discussed safety belts, safety helmets, smoke detector, smoking near bedding or  upholstery.   Dental health: Discussed importance of regular tooth brushing, flossing, and dental visits.   Follow up plan: NEXT PREVENTATIVE PHYSICAL DUE IN 1 YEAR. Return in about 2 months (around 03/04/2019) for follow up BP.

## 2019-01-01 NOTE — Assessment & Plan Note (Signed)
Rechecking levels today. Await results. Treat as needed.  

## 2019-01-02 LAB — COMPREHENSIVE METABOLIC PANEL
ALT: 67 IU/L — ABNORMAL HIGH (ref 0–44)
AST: 44 IU/L — ABNORMAL HIGH (ref 0–40)
Albumin/Globulin Ratio: 1.9 (ref 1.2–2.2)
Albumin: 4.7 g/dL (ref 3.8–4.8)
Alkaline Phosphatase: 86 IU/L (ref 39–117)
BUN/Creatinine Ratio: 16 (ref 10–24)
BUN: 13 mg/dL (ref 8–27)
Bilirubin Total: 0.4 mg/dL (ref 0.0–1.2)
CO2: 25 mmol/L (ref 20–29)
Calcium: 9.3 mg/dL (ref 8.6–10.2)
Chloride: 103 mmol/L (ref 96–106)
Creatinine, Ser: 0.79 mg/dL (ref 0.76–1.27)
GFR calc Af Amer: 112 mL/min/{1.73_m2} (ref >59)
GFR calc non Af Amer: 97 mL/min/{1.73_m2} (ref >59)
Globulin, Total: 2.5 g/dL (ref 1.5–4.5)
Glucose: 96 mg/dL (ref 65–99)
Potassium: 4.8 mmol/L (ref 3.5–5.2)
Sodium: 140 mmol/L (ref 134–144)
Total Protein: 7.2 g/dL (ref 6.0–8.5)

## 2019-01-02 LAB — CBC WITH DIFFERENTIAL/PLATELET
Basophils Absolute: 0.1 10*3/uL (ref 0.0–0.2)
Basos: 1 %
EOS (ABSOLUTE): 0.3 10*3/uL (ref 0.0–0.4)
Eos: 4 %
Hematocrit: 45.2 % (ref 37.5–51.0)
Hemoglobin: 15.8 g/dL (ref 13.0–17.7)
Immature Grans (Abs): 0 10*3/uL (ref 0.0–0.1)
Immature Granulocytes: 1 %
Lymphocytes Absolute: 1.9 10*3/uL (ref 0.7–3.1)
Lymphs: 25 %
MCH: 33.1 pg — ABNORMAL HIGH (ref 26.6–33.0)
MCHC: 35 g/dL (ref 31.5–35.7)
MCV: 95 fL (ref 79–97)
Monocytes Absolute: 0.8 10*3/uL (ref 0.1–0.9)
Monocytes: 10 %
Neutrophils Absolute: 4.7 10*3/uL (ref 1.4–7.0)
Neutrophils: 59 %
Platelets: 245 10*3/uL (ref 150–450)
RBC: 4.78 x10E6/uL (ref 4.14–5.80)
RDW: 12.9 % (ref 11.6–15.4)
WBC: 7.8 10*3/uL (ref 3.4–10.8)

## 2019-01-02 LAB — TSH: TSH: 2.78 u[IU]/mL (ref 0.450–4.500)

## 2019-01-02 LAB — VITAMIN D 25 HYDROXY (VIT D DEFICIENCY, FRACTURES): Vit D, 25-Hydroxy: 15.9 ng/mL — ABNORMAL LOW (ref 30.0–100.0)

## 2019-01-02 LAB — LIPID PANEL W/O CHOL/HDL RATIO
Cholesterol, Total: 182 mg/dL (ref 100–199)
HDL: 58 mg/dL (ref >39)
LDL Chol Calc (NIH): 98 mg/dL (ref 0–99)
Triglycerides: 147 mg/dL (ref 0–149)
VLDL Cholesterol Cal: 26 mg/dL (ref 5–40)

## 2019-01-02 LAB — PSA: Prostate Specific Ag, Serum: 0.9 ng/mL (ref 0.0–4.0)

## 2019-01-05 ENCOUNTER — Other Ambulatory Visit: Payer: Self-pay | Admitting: Family Medicine

## 2019-01-05 MED ORDER — VITAMIN D (ERGOCALCIFEROL) 1.25 MG (50000 UNIT) PO CAPS: 50000.0000 [IU] | ORAL_CAPSULE | ORAL | 1 refills | Status: DC

## 2019-01-29 ENCOUNTER — Encounter: Payer: Self-pay | Admitting: *Deleted

## 2019-03-05 ENCOUNTER — Ambulatory Visit: Payer: Managed Care, Other (non HMO) | Admitting: Family Medicine

## 2019-03-11 ENCOUNTER — Other Ambulatory Visit: Payer: Self-pay | Admitting: Family Medicine

## 2019-03-26 ENCOUNTER — Ambulatory Visit: Payer: Managed Care, Other (non HMO) | Admitting: Family Medicine

## 2019-04-09 ENCOUNTER — Other Ambulatory Visit: Payer: Self-pay

## 2019-04-09 ENCOUNTER — Ambulatory Visit (INDEPENDENT_AMBULATORY_CARE_PROVIDER_SITE_OTHER): Payer: Managed Care, Other (non HMO) | Admitting: Family Medicine

## 2019-04-09 ENCOUNTER — Encounter: Payer: Self-pay | Admitting: Family Medicine

## 2019-04-09 VITALS — BP 148/99 | HR 68 | Temp 97.6°F | Ht 73.9 in | Wt 281.0 lb

## 2019-04-09 DIAGNOSIS — I10 Essential (primary) hypertension: Secondary | ICD-10-CM | POA: Insufficient documentation

## 2019-04-09 MED ORDER — LOSARTAN POTASSIUM 25 MG PO TABS: 25.0000 mg | ORAL_TABLET | Freq: Every day | ORAL | 3 refills | Status: DC

## 2019-04-09 NOTE — Assessment & Plan Note (Signed)
Will start losartan and recheck 1 month. Call with any concerns.

## 2019-04-09 NOTE — Progress Notes (Signed)
BP (!) 148/99   Pulse 68   Temp 97.6 F (36.4 C) (Oral)   Ht 6' 1.9" (1.877 m)   Wt 281 lb (127.5 kg)   SpO2 96%   BMI 36.18 kg/m    Subjective:    Patient ID: Nicholas Wall, male    DOB: 1955-11-19, 64 y.o.   MRN: [id number]  HPI: Nicholas Wall is a 64 y.o. male  Chief Complaint  Patient presents with  . Hypertension   HYPERTENSION Hypertension status: stable  Satisfied with current treatment? no Duration of hypertension: chronic BP monitoring frequency:  monthly BP range: 140s/90s BP medication side effects:  no Aspirin: no Recurrent headaches: no Visual changes: no Palpitations: no Dyspnea: no Chest pain: no Lower extremity edema: no Dizzy/lightheaded: no  Relevant past medical, surgical, family and social history reviewed and updated as indicated. Interim medical history since our last visit reviewed. Allergies and medications reviewed and updated.  Review of Systems  Constitutional: Negative.   Respiratory: Negative.   Cardiovascular: Negative.   Musculoskeletal: Negative.   Neurological: Negative.   Psychiatric/Behavioral: Negative.     Per HPI unless specifically indicated above     Objective:    BP (!) 148/99   Pulse 68   Temp 97.6 F (36.4 C) (Oral)   Ht 6' 1.9" (1.877 m)   Wt 281 lb (127.5 kg)   SpO2 96%   BMI 36.18 kg/m   Wt Readings from Last 3 Encounters:  04/09/19 281 lb (127.5 kg)  01/01/19 274 lb 4 oz (124.4 kg)  03/27/18 271 lb (122.9 kg)    Physical Exam Vitals and nursing note reviewed.  Constitutional:      General: He is not in acute distress.    Appearance: Normal appearance. He is not ill-appearing, toxic-appearing or diaphoretic.  HENT:     Head: Normocephalic and atraumatic.     Right Ear: External ear normal.     Left Ear: External ear normal.     Nose: Nose normal.     Mouth/Throat:     Mouth: Mucous membranes are moist.     Pharynx: Oropharynx is clear.  Eyes:     General: No scleral icterus.     Right eye: No discharge.        Left eye: No discharge.     Extraocular Movements: Extraocular movements intact.     Conjunctiva/sclera: Conjunctivae normal.     Pupils: Pupils are equal, round, and reactive to light.  Cardiovascular:     Rate and Rhythm: Normal rate and regular rhythm.     Pulses: Normal pulses.     Heart sounds: Normal heart sounds. No murmur. No friction rub. No gallop.   Pulmonary:     Effort: Pulmonary effort is normal. No respiratory distress.     Breath sounds: Normal breath sounds. No stridor. No wheezing, rhonchi or rales.  Chest:     Chest wall: No tenderness.  Musculoskeletal:        General: Normal range of motion.     Cervical back: Normal range of motion and neck supple.  Skin:    General: Skin is warm and dry.     Capillary Refill: Capillary refill takes less than 2 seconds.     Coloration: Skin is not jaundiced or pale.     Findings: No bruising, erythema, lesion or rash.  Neurological:     General: No focal deficit present.     Mental Status: He is alert and oriented to person, place, and  time. Mental status is at baseline.  Psychiatric:        Mood and Affect: Mood normal.        Behavior: Behavior normal.        Thought Content: Thought content normal.        Judgment: Judgment normal.     Results for orders placed or performed in visit on 01/01/19  CBC with Differential OUT  Result Value Ref Range   WBC 7.8 3.4 - 10.8 x10E3/uL   RBC 4.78 4.14 - 5.80 x10E6/uL   Hemoglobin 15.8 13.0 - 17.7 g/dL   Hematocrit 45.2 37.5 - 51.0 %   MCV 95 79 - 97 fL   MCH 33.1 (H) 26.6 - 33.0 pg   MCHC 35.0 31.5 - 35.7 g/dL   RDW 12.9 11.6 - 15.4 %   Platelets 245 150 - 450 x10E3/uL   Neutrophils 59 Not Estab. %   Lymphs 25 Not Estab. %   Monocytes 10 Not Estab. %   Eos 4 Not Estab. %   Basos 1 Not Estab. %   Neutrophils Absolute 4.7 1.4 - 7.0 x10E3/uL   Lymphocytes Absolute 1.9 0.7 - 3.1 x10E3/uL   Monocytes Absolute 0.8 0.1 - 0.9 x10E3/uL   EOS  (ABSOLUTE) 0.3 0.0 - 0.4 x10E3/uL   Basophils Absolute 0.1 0.0 - 0.2 x10E3/uL   Immature Granulocytes 1 Not Estab. %   Immature Grans (Abs) 0.0 0.0 - 0.1 x10E3/uL  Comp Met (CMET)  Result Value Ref Range   Glucose 96 65 - 99 mg/dL   BUN 13 8 - 27 mg/dL   Creatinine, Ser 0.79 0.76 - 1.27 mg/dL   GFR calc non Af Amer 97 >59 mL/min/1.73   GFR calc Af Amer 112 >59 mL/min/1.73   BUN/Creatinine Ratio 16 10 - 24   Sodium 140 134 - 144 mmol/L   Potassium 4.8 3.5 - 5.2 mmol/L   Chloride 103 96 - 106 mmol/L   CO2 25 20 - 29 mmol/L   Calcium 9.3 8.6 - 10.2 mg/dL   Total Protein 7.2 6.0 - 8.5 g/dL   Albumin 4.7 3.8 - 4.8 g/dL   Globulin, Total 2.5 1.5 - 4.5 g/dL   Albumin/Globulin Ratio 1.9 1.2 - 2.2   Bilirubin Total 0.4 0.0 - 1.2 mg/dL   Alkaline Phosphatase 86 39 - 117 IU/L   AST 44 (H) 0 - 40 IU/L   ALT 67 (H) 0 - 44 IU/L  Lipid Panel w/o Chol/HDL Ratio OUT  Result Value Ref Range   Cholesterol, Total 182 100 - 199 mg/dL   Triglycerides 147 0 - 149 mg/dL   HDL 58 >39 mg/dL   VLDL Cholesterol Cal 26 5 - 40 mg/dL   LDL Chol Calc (NIH) 98 0 - 99 mg/dL  PSA  Result Value Ref Range   Prostate Specific Ag, Serum 0.9 0.0 - 4.0 ng/mL  TSH  Result Value Ref Range   TSH 2.780 0.450 - 4.500 uIU/mL  Vit D  25 hydroxy (rtn osteoporosis monitoring)  Result Value Ref Range   Vit D, 25-Hydroxy 15.9 (L) 30.0 - 100.0 ng/mL  Microalbumin, Urine Waived  Result Value Ref Range   Microalb, Ur Waived 30 (H) 0 - 19 mg/L   Creatinine, Urine Waived 300 10 - 300 mg/dL   Microalb/Creat Ratio <30 <30 mg/g      Assessment & Plan:   Problem List Items Addressed This Visit      Cardiovascular and Mediastinum   Essential hypertension - Primary  Will start losartan and recheck 1 month. Call with any concerns.       Relevant Medications   losartan (COZAAR) 25 MG tablet       Follow up plan: Return in about 4 weeks (around 05/07/2019) for follow up blood pressure.

## 2019-05-07 ENCOUNTER — Other Ambulatory Visit: Payer: Self-pay

## 2019-05-07 ENCOUNTER — Ambulatory Visit (INDEPENDENT_AMBULATORY_CARE_PROVIDER_SITE_OTHER): Payer: Managed Care, Other (non HMO) | Admitting: Family Medicine

## 2019-05-07 ENCOUNTER — Encounter: Payer: Self-pay | Admitting: Family Medicine

## 2019-05-07 VITALS — BP 157/100 | HR 65 | Temp 98.0°F | Wt 275.8 lb

## 2019-05-07 DIAGNOSIS — I10 Essential (primary) hypertension: Secondary | ICD-10-CM

## 2019-05-07 DIAGNOSIS — E559 Vitamin D deficiency, unspecified: Secondary | ICD-10-CM | POA: Diagnosis not present

## 2019-05-07 MED ORDER — LOSARTAN POTASSIUM 25 MG PO TABS: 25.0000 mg | ORAL_TABLET | Freq: Every day | ORAL | 1 refills | Status: DC

## 2019-05-07 NOTE — Assessment & Plan Note (Signed)
Rechecking levels today. Await results. Call with any concerns.  

## 2019-05-07 NOTE — Progress Notes (Signed)
BP (!) 157/100 (BP Location: Left Arm, Patient Position: Sitting, Cuff Size: Large)   Pulse 65   Temp 98 F (36.7 C) (Oral)   Wt 275 lb 12.8 oz (125.1 kg)   SpO2 100%   BMI 35.51 kg/m    Subjective:    Patient ID: Nicholas Wall, male    DOB: 07-Jan-1956, 64 y.o.   MRN: [id number]  HPI: Nicholas Wall is a 64 y.o. male  Chief Complaint  Patient presents with  . Hypertension   HYPERTENSION Hypertension status: better  Satisfied with current treatment? yes Duration of hypertension: months BP monitoring frequency:  a few times a week BP range: 120s-130s/80s BP medication side effects:  no Medication compliance: excellent compliance Previous BP meds: losartan Aspirin: no Recurrent headaches: no Visual changes: no Palpitations: no Dyspnea: no Chest pain: no Lower extremity edema: no Dizzy/lightheaded: no  Relevant past medical, surgical, family and social history reviewed and updated as indicated. Interim medical history since our last visit reviewed. Allergies and medications reviewed and updated.  Review of Systems  Constitutional: Negative.   Respiratory: Negative.   Cardiovascular: Negative.   Gastrointestinal: Negative.   Musculoskeletal: Negative.   Psychiatric/Behavioral: Negative.     Per HPI unless specifically indicated above     Objective:    BP (!) 157/100 (BP Location: Left Arm, Patient Position: Sitting, Cuff Size: Large)   Pulse 65   Temp 98 F (36.7 C) (Oral)   Wt 275 lb 12.8 oz (125.1 kg)   SpO2 100%   BMI 35.51 kg/m   Wt Readings from Last 3 Encounters:  05/07/19 275 lb 12.8 oz (125.1 kg)  04/09/19 281 lb (127.5 kg)  01/01/19 274 lb 4 oz (124.4 kg)    Physical Exam Vitals and nursing note reviewed.  Constitutional:      General: He is not in acute distress.    Appearance: Normal appearance. He is not ill-appearing, toxic-appearing or diaphoretic.  HENT:     Head: Normocephalic and atraumatic.     Right Ear: External ear normal.      Left Ear: External ear normal.     Nose: Nose normal.     Mouth/Throat:     Mouth: Mucous membranes are moist.     Pharynx: Oropharynx is clear.  Eyes:     General: No scleral icterus.       Right eye: No discharge.        Left eye: No discharge.     Extraocular Movements: Extraocular movements intact.     Conjunctiva/sclera: Conjunctivae normal.     Pupils: Pupils are equal, round, and reactive to light.  Cardiovascular:     Rate and Rhythm: Normal rate and regular rhythm.     Pulses: Normal pulses.     Heart sounds: Normal heart sounds. No murmur. No friction rub. No gallop.   Pulmonary:     Effort: Pulmonary effort is normal. No respiratory distress.     Breath sounds: Normal breath sounds. No stridor. No wheezing, rhonchi or rales.  Chest:     Chest wall: No tenderness.  Musculoskeletal:        General: Normal range of motion.     Cervical back: Normal range of motion and neck supple.  Skin:    General: Skin is warm and dry.     Capillary Refill: Capillary refill takes less than 2 seconds.     Coloration: Skin is not jaundiced or pale.     Findings: No bruising, erythema, lesion or  rash.  Neurological:     General: No focal deficit present.     Mental Status: He is alert and oriented to person, place, and time. Mental status is at baseline.  Psychiatric:        Mood and Affect: Mood normal.        Behavior: Behavior normal.        Thought Content: Thought content normal.        Judgment: Judgment normal.     Results for orders placed or performed in visit on 01/01/19  CBC with Differential OUT  Result Value Ref Range   WBC 7.8 3.4 - 10.8 x10E3/uL   RBC 4.78 4.14 - 5.80 x10E6/uL   Hemoglobin 15.8 13.0 - 17.7 g/dL   Hematocrit 45.2 37.5 - 51.0 %   MCV 95 79 - 97 fL   MCH 33.1 (H) 26.6 - 33.0 pg   MCHC 35.0 31.5 - 35.7 g/dL   RDW 12.9 11.6 - 15.4 %   Platelets 245 150 - 450 x10E3/uL   Neutrophils 59 Not Estab. %   Lymphs 25 Not Estab. %   Monocytes 10 Not  Estab. %   Eos 4 Not Estab. %   Basos 1 Not Estab. %   Neutrophils Absolute 4.7 1.4 - 7.0 x10E3/uL   Lymphocytes Absolute 1.9 0.7 - 3.1 x10E3/uL   Monocytes Absolute 0.8 0.1 - 0.9 x10E3/uL   EOS (ABSOLUTE) 0.3 0.0 - 0.4 x10E3/uL   Basophils Absolute 0.1 0.0 - 0.2 x10E3/uL   Immature Granulocytes 1 Not Estab. %   Immature Grans (Abs) 0.0 0.0 - 0.1 x10E3/uL  Comp Met (CMET)  Result Value Ref Range   Glucose 96 65 - 99 mg/dL   BUN 13 8 - 27 mg/dL   Creatinine, Ser 0.79 0.76 - 1.27 mg/dL   GFR calc non Af Amer 97 >59 mL/min/1.73   GFR calc Af Amer 112 >59 mL/min/1.73   BUN/Creatinine Ratio 16 10 - 24   Sodium 140 134 - 144 mmol/L   Potassium 4.8 3.5 - 5.2 mmol/L   Chloride 103 96 - 106 mmol/L   CO2 25 20 - 29 mmol/L   Calcium 9.3 8.6 - 10.2 mg/dL   Total Protein 7.2 6.0 - 8.5 g/dL   Albumin 4.7 3.8 - 4.8 g/dL   Globulin, Total 2.5 1.5 - 4.5 g/dL   Albumin/Globulin Ratio 1.9 1.2 - 2.2   Bilirubin Total 0.4 0.0 - 1.2 mg/dL   Alkaline Phosphatase 86 39 - 117 IU/L   AST 44 (H) 0 - 40 IU/L   ALT 67 (H) 0 - 44 IU/L  Lipid Panel w/o Chol/HDL Ratio OUT  Result Value Ref Range   Cholesterol, Total 182 100 - 199 mg/dL   Triglycerides 147 0 - 149 mg/dL   HDL 58 >39 mg/dL   VLDL Cholesterol Cal 26 5 - 40 mg/dL   LDL Chol Calc (NIH) 98 0 - 99 mg/dL  PSA  Result Value Ref Range   Prostate Specific Ag, Serum 0.9 0.0 - 4.0 ng/mL  TSH  Result Value Ref Range   TSH 2.780 0.450 - 4.500 uIU/mL  Vit D  25 hydroxy (rtn osteoporosis monitoring)  Result Value Ref Range   Vit D, 25-Hydroxy 15.9 (L) 30.0 - 100.0 ng/mL  Microalbumin, Urine Waived  Result Value Ref Range   Microalb, Ur Waived 30 (H) 0 - 19 mg/L   Creatinine, Urine Waived 300 10 - 300 mg/dL   Microalb/Creat Ratio <30 <30 mg/g  Assessment & Plan:   Problem List Items Addressed This Visit      Cardiovascular and Mediastinum   Essential hypertension - Primary    Did not take meds this AM. Doing well at home. Continue  current regimen. Continue to monitor. Call with any concerns. Recheck 6 months.       Relevant Medications   losartan (COZAAR) 25 MG tablet   Other Relevant Orders   Basic metabolic panel     Other   Vitamin D deficiency    Rechecking levels today. Await results. Call with any concerns.       Relevant Orders   VITAMIN D 25 Hydroxy (Vit-D Deficiency, Fractures)       Follow up plan: Return in about 6 months (around 11/06/2019).

## 2019-05-07 NOTE — Assessment & Plan Note (Signed)
Did not take meds this AM. Doing well at home. Continue current regimen. Continue to monitor. Call with any concerns. Recheck 6 months.

## 2019-05-08 LAB — VITAMIN D 25 HYDROXY (VIT D DEFICIENCY, FRACTURES): Vit D, 25-Hydroxy: 31.3 ng/mL (ref 30.0–100.0)

## 2019-05-08 LAB — BASIC METABOLIC PANEL
BUN/Creatinine Ratio: 18 (ref 10–24)
BUN: 14 mg/dL (ref 8–27)
CO2: 24 mmol/L (ref 20–29)
Calcium: 9.2 mg/dL (ref 8.6–10.2)
Chloride: 105 mmol/L (ref 96–106)
Creatinine, Ser: 0.79 mg/dL (ref 0.76–1.27)
GFR calc Af Amer: 112 mL/min/{1.73_m2} (ref >59)
GFR calc non Af Amer: 97 mL/min/{1.73_m2} (ref >59)
Glucose: 136 mg/dL — ABNORMAL HIGH (ref 65–99)
Potassium: 4.7 mmol/L (ref 3.5–5.2)
Sodium: 141 mmol/L (ref 134–144)

## 2019-07-06 ENCOUNTER — Other Ambulatory Visit: Payer: Self-pay | Admitting: Family Medicine

## 2019-07-06 NOTE — Telephone Encounter (Signed)
He was normal last check. He does not need to keep taking this

## 2019-07-06 NOTE — Telephone Encounter (Signed)
Requested medications are due for refill today?  Yes - 50,000 IU strengths cannot be delegated.    Requested medications are on active medication list?  Yes  Last Refill:   01/05/2019  # 12 capsules with one refill   Future visit scheduled?  Yes in 4 months.    Notes to Clinic:  50,000 IU strengths cannot be delegated.

## 2019-11-12 ENCOUNTER — Ambulatory Visit: Payer: Managed Care, Other (non HMO) | Admitting: Family Medicine

## 2019-11-16 ENCOUNTER — Other Ambulatory Visit: Payer: Self-pay | Admitting: Nurse Practitioner

## 2019-11-16 DIAGNOSIS — R739 Hyperglycemia, unspecified: Secondary | ICD-10-CM

## 2019-11-26 ENCOUNTER — Ambulatory Visit: Payer: Managed Care, Other (non HMO) | Admitting: Family Medicine

## 2019-11-26 ENCOUNTER — Encounter: Payer: Self-pay | Admitting: Family Medicine

## 2019-11-26 ENCOUNTER — Other Ambulatory Visit: Payer: Self-pay

## 2019-11-26 VITALS — BP 145/91 | HR 63 | Temp 98.4°F | Ht 73.9 in | Wt 273.0 lb

## 2019-11-26 DIAGNOSIS — E559 Vitamin D deficiency, unspecified: Secondary | ICD-10-CM | POA: Diagnosis not present

## 2019-11-26 DIAGNOSIS — K219 Gastro-esophageal reflux disease without esophagitis: Secondary | ICD-10-CM | POA: Diagnosis not present

## 2019-11-26 DIAGNOSIS — I10 Essential (primary) hypertension: Secondary | ICD-10-CM

## 2019-11-26 DIAGNOSIS — E039 Hypothyroidism, unspecified: Secondary | ICD-10-CM

## 2019-11-26 LAB — CBC WITH DIFFERENTIAL/PLATELET
Hematocrit: 41.2 % (ref 37.5–51.0)
Hemoglobin: 14.9 g/dL (ref 13.0–17.7)
Lymphocytes Absolute: 2 10*3/uL (ref 0.7–3.1)
Lymphs: 21 %
MCH: 33.5 pg — ABNORMAL HIGH (ref 26.6–33.0)
MCHC: 36.2 g/dL — ABNORMAL HIGH (ref 31.5–35.7)
MCV: 93 fL (ref 79–97)
MID (Absolute): 0.7 10*3/uL (ref 0.1–1.6)
MID: 7 %
Neutrophils Absolute: 6.8 10*3/uL (ref 1.4–7.0)
Neutrophils: 72 %
Platelets: 219 10*3/uL (ref 150–450)
RBC: 4.45 x10E6/uL (ref 4.14–5.80)
RDW: 12.9 % (ref 11.6–15.4)
WBC: 9.5 10*3/uL (ref 3.4–10.8)

## 2019-11-26 MED ORDER — LOSARTAN POTASSIUM 50 MG PO TABS
50.0000 mg | ORAL_TABLET | Freq: Every day | ORAL | 1 refills | Status: DC
Start: 2019-11-26 — End: 2020-05-19

## 2019-11-26 MED ORDER — OMEPRAZOLE 20 MG PO CPDR
DELAYED_RELEASE_CAPSULE | ORAL | 3 refills | Status: DC
Start: 2019-11-26 — End: 2020-12-29

## 2019-11-26 NOTE — Assessment & Plan Note (Signed)
Under good control on current regimen. Continue current regimen. Continue to monitor. Call with any concerns. Refills given.   

## 2019-11-26 NOTE — Assessment & Plan Note (Signed)
Rechecking labs today. Await results. Treat as needed.  °

## 2019-11-26 NOTE — Assessment & Plan Note (Addendum)
Not under good control on current regimen. Will increase losartan to 50mg  and recheck 1 month. Call with any concerns.

## 2019-11-26 NOTE — Progress Notes (Addendum)
BP (!) 145/91   Pulse 63   Temp 98.4 F (36.9 C) (Oral)   Ht 6' 1.9" (1.877 m)   Wt 273 lb (123.8 kg)   SpO2 99%   BMI 35.15 kg/m    Subjective:    Patient ID: Nicholas Wall, male    DOB: [birth year], 64 y.o.   MRN: [id number]  HPI: Nicholas Wall is a 64 y.o. male  Chief Complaint  Patient presents with  . Hypothyroidism    Follow up. Patient declines flu shot.  . Hypertension    Follow up   HYPERTENSION Hypertension status: controlled  Satisfied with current treatment? no Duration of hypertension: chronic BP monitoring frequency:  not checking BP medication side effects:  no Medication compliance: excellent compliance Previous BP meds: losartan Aspirin: no Recurrent headaches: no Visual changes: no Palpitations: no Dyspnea: no Chest pain: no Lower extremity edema: no Dizzy/lightheaded: no  HYPOTHYROIDISM Thyroid control status:controlled Satisfied with current treatment? no Medication side effects: no Medication compliance: excellent compliance Recent dose adjustment:no Fatigue: no Cold intolerance: no Heat intolerance: no Weight gain: no Weight loss: no Constipation: no Diarrhea/loose stools: no Palpitations: no Lower extremity edema: no Anxiety/depressed mood: no  Relevant past medical, surgical, family and social history reviewed and updated as indicated. Interim medical history since our last visit reviewed. Allergies and medications reviewed and updated.  Review of Systems  Constitutional: Negative.   Respiratory: Negative.   Cardiovascular: Negative.   Gastrointestinal: Negative.   Musculoskeletal: Negative.   Neurological: Negative.   Psychiatric/Behavioral: Negative.     Per HPI unless specifically indicated above     Objective:    BP (!) 145/91   Pulse 63   Temp 98.4 F (36.9 C) (Oral)   Ht 6' 1.9" (1.877 m)   Wt 273 lb (123.8 kg)   SpO2 99%   BMI 35.15 kg/m   Wt Readings from Last 3 Encounters:  11/26/19 273 lb (123.8  kg)  05/07/19 275 lb 12.8 oz (125.1 kg)  04/09/19 281 lb (127.5 kg)    Physical Exam Vitals and nursing note reviewed.  Constitutional:      General: He is not in acute distress.    Appearance: Normal appearance. He is not ill-appearing, toxic-appearing or diaphoretic.  HENT:     Head: Normocephalic and atraumatic.     Right Ear: External ear normal.     Left Ear: External ear normal.     Nose: Nose normal.     Mouth/Throat:     Mouth: Mucous membranes are moist.     Pharynx: Oropharynx is clear.  Eyes:     General: No scleral icterus.       Right eye: No discharge.        Left eye: No discharge.     Extraocular Movements: Extraocular movements intact.     Conjunctiva/sclera: Conjunctivae normal.     Pupils: Pupils are equal, round, and reactive to light.  Cardiovascular:     Rate and Rhythm: Normal rate and regular rhythm.     Pulses: Normal pulses.     Heart sounds: Normal heart sounds. No murmur heard.  No friction rub. No gallop.   Pulmonary:     Effort: Pulmonary effort is normal. No respiratory distress.     Breath sounds: Normal breath sounds. No stridor. No wheezing, rhonchi or rales.  Chest:     Chest wall: No tenderness.  Musculoskeletal:        General: Normal range of motion.  Cervical back: Normal range of motion and neck supple.  Skin:    General: Skin is warm and dry.     Capillary Refill: Capillary refill takes less than 2 seconds.     Coloration: Skin is not jaundiced or pale.     Findings: No bruising, erythema, lesion or rash.  Neurological:     General: No focal deficit present.     Mental Status: He is alert and oriented to person, place, and time. Mental status is at baseline.  Psychiatric:        Mood and Affect: Mood normal.        Behavior: Behavior normal.        Thought Content: Thought content normal.        Judgment: Judgment normal.     Results for orders placed or performed in visit on 57/50/51  Basic metabolic panel  Result  Value Ref Range   Glucose 136 (H) 65 - 99 mg/dL   BUN 14 8 - 27 mg/dL   Creatinine, Ser 0.79 0.76 - 1.27 mg/dL   GFR calc non Af Amer 97 >59 mL/min/1.73   GFR calc Af Amer 112 >59 mL/min/1.73   BUN/Creatinine Ratio 18 10 - 24   Sodium 141 134 - 144 mmol/L   Potassium 4.7 3.5 - 5.2 mmol/L   Chloride 105 96 - 106 mmol/L   CO2 24 20 - 29 mmol/L   Calcium 9.2 8.6 - 10.2 mg/dL  VITAMIN D 25 Hydroxy (Vit-D Deficiency, Fractures)  Result Value Ref Range   Vit D, 25-Hydroxy 31.3 30.0 - 100.0 ng/mL      Assessment & Plan:   Problem List Items Addressed This Visit      Cardiovascular and Mediastinum   Essential hypertension - Primary    Not under good control on current regimen. Will increase losartan to 50mg  and recheck 1 month. Call with any concerns.       Relevant Medications   losartan (COZAAR) 50 MG tablet   Other Relevant Orders   Comprehensive metabolic panel     Digestive   GERD (gastroesophageal reflux disease)    Under good control on current regimen. Continue current regimen. Continue to monitor. Call with any concerns. Refills given.        Relevant Medications   omeprazole (PRILOSEC) 20 MG capsule   Other Relevant Orders   CBC With Differential/Platelet     Endocrine   Hypothyroidism    Rechecking labs today. Await results. Treat as needed.       Relevant Orders   TSH     Other   Vitamin D deficiency    Rechecking labs today. Await results. Treat as needed.       Relevant Orders   VITAMIN D 25 Hydroxy (Vit-D Deficiency, Fractures)       Follow up plan: Return 4-8 weeks physical.

## 2019-11-27 LAB — COMPREHENSIVE METABOLIC PANEL
ALT: 84 IU/L — ABNORMAL HIGH (ref 0–44)
AST: 65 IU/L — ABNORMAL HIGH (ref 0–40)
Albumin/Globulin Ratio: 2 (ref 1.2–2.2)
Albumin: 4.5 g/dL (ref 3.8–4.8)
Alkaline Phosphatase: 76 IU/L (ref 44–121)
BUN/Creatinine Ratio: 19 (ref 10–24)
BUN: 15 mg/dL (ref 8–27)
Bilirubin Total: 0.6 mg/dL (ref 0.0–1.2)
CO2: 25 mmol/L (ref 20–29)
Calcium: 9.2 mg/dL (ref 8.6–10.2)
Chloride: 100 mmol/L (ref 96–106)
Creatinine, Ser: 0.79 mg/dL (ref 0.76–1.27)
GFR calc Af Amer: 112 mL/min/{1.73_m2} (ref >59)
GFR calc non Af Amer: 97 mL/min/{1.73_m2} (ref >59)
Globulin, Total: 2.3 g/dL (ref 1.5–4.5)
Glucose: 120 mg/dL — ABNORMAL HIGH (ref 65–99)
Potassium: 4.5 mmol/L (ref 3.5–5.2)
Sodium: 137 mmol/L (ref 134–144)
Total Protein: 6.8 g/dL (ref 6.0–8.5)

## 2019-11-27 LAB — VITAMIN D 25 HYDROXY (VIT D DEFICIENCY, FRACTURES): Vit D, 25-Hydroxy: 19.3 ng/mL — ABNORMAL LOW (ref 30.0–100.0)

## 2019-11-27 LAB — TSH: TSH: 3 u[IU]/mL (ref 0.450–4.500)

## 2019-11-29 ENCOUNTER — Other Ambulatory Visit: Payer: Self-pay | Admitting: Family Medicine

## 2019-11-29 MED ORDER — VITAMIN D (ERGOCALCIFEROL) 1.25 MG (50000 UNIT) PO CAPS: 50000.0000 [IU] | ORAL_CAPSULE | ORAL | 1 refills | Status: DC

## 2019-11-29 MED ORDER — LEVOTHYROXINE SODIUM 75 MCG PO TABS: 75.0000 ug | ORAL_TABLET | Freq: Every day | ORAL | 3 refills | Status: DC

## 2019-12-02 ENCOUNTER — Ambulatory Visit: Payer: Managed Care, Other (non HMO) | Admitting: Family Medicine

## 2020-01-02 ENCOUNTER — Other Ambulatory Visit: Payer: Self-pay | Admitting: Family Medicine

## 2020-02-04 ENCOUNTER — Encounter: Payer: Managed Care, Other (non HMO) | Admitting: Family Medicine

## 2020-02-07 ENCOUNTER — Ambulatory Visit: Payer: Managed Care, Other (non HMO) | Admitting: Family Medicine

## 2020-02-12 ENCOUNTER — Other Ambulatory Visit: Payer: Self-pay | Admitting: Family Medicine

## 2020-05-01 ENCOUNTER — Other Ambulatory Visit: Payer: Self-pay | Admitting: Family Medicine

## 2020-05-01 NOTE — Telephone Encounter (Signed)
Pt is due for apt

## 2020-05-01 NOTE — Telephone Encounter (Signed)
Lvm to make this apt. 

## 2020-05-01 NOTE — Telephone Encounter (Signed)
Requested medication (s) are due for refill today: yes  Requested medication (s) are on the active medication list: yes  Last refill: 02/12/20  Future visit scheduled: no  Notes to clinic:  not delegated    Requested Prescriptions  Pending Prescriptions Disp Refills   Vitamin D, Ergocalciferol, (DRISDOL) 1.25 MG (50000 UNIT) CAPS capsule [Pharmacy Med Name: VITAMIN D2 1.25MG (50,000 UNIT)] 12 capsule 1    Sig: Take 1 capsule (50,000 Units total) by mouth every 7 (seven) days.      Endocrinology:  Vitamins - Vitamin D Supplementation Failed - 05/01/2020  1:22 AM      Failed - 50,000 IU strengths are not delegated      Failed - Phosphate in normal range and within 360 days    No results found for: PHOS        Failed - Vitamin D in normal range and within 360 days    Vit D, 25-Hydroxy  Date Value Ref Range Status  11/26/2019 19.3 (L) 30.0 - 100.0 ng/mL Final    Comment:    Vitamin D deficiency has been defined by the Institute of Medicine and an Endocrine Society practice guideline as a level of serum 25-OH vitamin D less than 20 ng/mL (1,2). The Endocrine Society went on to further define vitamin D insufficiency as a level between 21 and 29 ng/mL (2). 1. IOM (Institute of Medicine). 2010. Dietary reference    intakes for calcium and D. [place]: The    [employer]. 2. Holick MF, Binkley [place], Bischoff-Ferrari HA, et al.    Evaluation, treatment, and prevention of vitamin D    deficiency: an Endocrine Society clinical practice    guideline. JCEM. 2011 Jul; 96(7):1911-30.           Passed - Ca in normal range and within 360 days    Calcium  Date Value Ref Range Status  11/26/2019 9.2 8.6 - 10.2 mg/dL Final          Passed - Valid encounter within last 12 months    Recent Outpatient Visits           5 months ago Essential hypertension   [place], [place], DO   12 months ago Essential hypertension   [place], [place], DO   1 year ago Essential hypertension   [place], [place], DO   1 year ago Routine general medical examination at a health care facility   [facility], [place], DO   2 years ago Neuropathy   [place], [place], DO

## 2020-05-02 NOTE — Telephone Encounter (Signed)
Pt scheduled per [facility] for 05/12/2020

## 2020-05-12 ENCOUNTER — Ambulatory Visit: Payer: Managed Care, Other (non HMO) | Admitting: Family Medicine

## 2020-05-19 ENCOUNTER — Other Ambulatory Visit: Payer: Self-pay

## 2020-05-19 ENCOUNTER — Ambulatory Visit
Admission: RE | Admit: 2020-05-19 | Discharge: 2020-05-19 | Disposition: A | Discharge status: Home / Self Care | Payer: Managed Care, Other (non HMO) | Attending: Family Medicine | Admitting: Family Medicine

## 2020-05-19 ENCOUNTER — Ambulatory Visit
Admission: RE | Admit: 2020-05-19 | Discharge: 2020-05-19 | Disposition: A | Discharge status: Home / Self Care | Payer: Managed Care, Other (non HMO) | Source: Ambulatory Visit | Attending: Family Medicine | Admitting: Family Medicine

## 2020-05-19 ENCOUNTER — Ambulatory Visit (INDEPENDENT_AMBULATORY_CARE_PROVIDER_SITE_OTHER): Payer: Managed Care, Other (non HMO) | Admitting: Family Medicine

## 2020-05-19 ENCOUNTER — Encounter: Payer: Self-pay | Admitting: Family Medicine

## 2020-05-19 VITALS — BP 137/87 | HR 66 | Wt 274.4 lb

## 2020-05-19 DIAGNOSIS — I10 Essential (primary) hypertension: Secondary | ICD-10-CM | POA: Diagnosis not present

## 2020-05-19 DIAGNOSIS — M79671 Pain in right foot: Secondary | ICD-10-CM | POA: Insufficient documentation

## 2020-05-19 DIAGNOSIS — E039 Hypothyroidism, unspecified: Secondary | ICD-10-CM | POA: Diagnosis not present

## 2020-05-19 DIAGNOSIS — M79641 Pain in right hand: Secondary | ICD-10-CM | POA: Diagnosis present

## 2020-05-19 DIAGNOSIS — E559 Vitamin D deficiency, unspecified: Secondary | ICD-10-CM

## 2020-05-19 DIAGNOSIS — K219 Gastro-esophageal reflux disease without esophagitis: Secondary | ICD-10-CM | POA: Diagnosis not present

## 2020-05-19 DIAGNOSIS — M79642 Pain in left hand: Secondary | ICD-10-CM

## 2020-05-19 DIAGNOSIS — M79672 Pain in left foot: Secondary | ICD-10-CM | POA: Diagnosis present

## 2020-05-19 MED ORDER — LOSARTAN POTASSIUM 50 MG PO TABS: 50.0000 mg | ORAL_TABLET | Freq: Every day | ORAL | 1 refills | Status: DC

## 2020-05-19 NOTE — Patient Instructions (Signed)
Arthritis Arthritis is a term that is commonly used to refer to joint pain or joint disease. There are more than 100 types of arthritis. What are the causes? The most common cause of this condition is wear and tear of a joint. Other causes include:  Gout.  Inflammation of a joint.  An infection of a joint.  Sprains and other injuries near the joint.  A reaction to medicines or drugs, or an allergic reaction. In some cases, the cause may not be known. What are the signs or symptoms? The main symptom of this condition is pain in the joint during movement. Other symptoms include:  Redness, swelling, or stiffness at a joint.  Warmth coming from the joint.  Fever.  Overall feeling of illness. How is this diagnosed? This condition may be diagnosed with a physical exam and tests, including:  Blood tests.  Urine tests.  Imaging tests, such as X-rays, an MRI, or a CT scan. Sometimes, fluid is removed from a joint for testing. How is this treated? This condition may be treated with:  Treatment of the cause, if it is known.  Rest.  Raising (elevating) the joint.  Applying cold or hot packs to the joint.  Medicines to improve symptoms and reduce inflammation.  Injections of a steroid such as cortisone into the joint to help reduce pain and inflammation. Depending on the cause of your arthritis, you may need to make lifestyle changes to reduce stress on your joint. Changes may include:  Exercising more.  Losing weight. Follow these instructions at home: Medicines  Take over-the-counter and prescription medicines only as told by your health care provider.  Do not take aspirin to relieve pain if your health care provider thinks that gout may be causing your pain. Activity  Rest your joint if told by your health care provider. Rest is important when your disease is active and your joint feels painful, swollen, or stiff.  Avoid activities that make the pain worse. It is  important to balance activity with rest.  Exercise your joint regularly with range-of-motion exercises as told by your health care provider. Try doing low-impact exercise, such as: ? Swimming. ? Water aerobics. ? Biking. ? Walking. Managing pain, stiffness, and swelling  If directed, put ice on the joint. ? Put ice in a plastic bag. ? Place a towel between your skin and the bag. ? Leave the ice on for 20 minutes, 2-3 times per day.  If your joint is swollen, raise (elevate) it above the level of your heart if directed by your health care provider.  If your joint feels stiff in the morning, try taking a warm shower.  If directed, apply heat to the affected area as often as told by your health care provider. Use the heat source that your health care provider recommends, such as a moist heat pack or a heating pad. If you have diabetes, do not apply heat without permission from your health care provider. To apply heat: ? Place a towel between your skin and the heat source. ? Leave the heat on for 20-30 minutes. ? Remove the heat if your skin turns bright red. This is especially important if you are unable to feel pain, heat, or cold. You may have a greater risk of getting burned.      General instructions  Do not use any products that contain nicotine or tobacco, such as cigarettes, e-cigarettes, and chewing tobacco. If you need help quitting, ask your health care provider.    Keep all follow-up visits as told by your health care provider. This is important. Contact a health care provider if:  The pain gets worse.  You have a fever. Get help right away if:  You develop severe joint pain, swelling, or redness.  Many joints become painful and swollen.  You develop severe back pain.  You develop severe weakness in your leg.  You cannot control your bladder or bowels. Summary  Arthritis is a term that is commonly used to refer to joint pain or joint disease. There are more than  100 types of arthritis.  The most common cause of this condition is wear and tear of a joint. Other causes include gout, inflammation or infection of the joint, sprains, or allergies.  Symptoms of this condition include redness, swelling, or stiffness of the joint. Other symptoms include warmth, fever, or feeling ill.  This condition is treated with rest, elevation, medicines, and applying cold or hot packs.  Follow your health care provider's instructions about medicines, activity, exercises, and other home care treatments. This information is not intended to replace advice given to you by your health care provider. Make sure you discuss any questions you have with your health care provider. Document Revised: 12/15/2017 Document Reviewed: 12/15/2017 Elsevier Patient Education  2021 [employer]  

## 2020-05-19 NOTE — Assessment & Plan Note (Signed)
Rechecking labs today. Await results. Treat as needed.  °

## 2020-05-19 NOTE — Assessment & Plan Note (Signed)
Under good control on current regimen. Continue current regimen. Continue to monitor. Call with any concerns. Refills given. Labs drawn today.   

## 2020-05-19 NOTE — Progress Notes (Signed)
BP 137/87   Pulse 66   Wt 274 lb 6.4 oz (124.5 kg)   SpO2 99%   BMI 35.33 kg/m    Subjective:    Patient ID: Nicholas Wall, male    DOB: 30-Apr-1955, 65 y.o.   MRN: [id number]  HPI: Nicholas Wall is a 65 y.o. male  Chief Complaint  Patient presents with  . Vitamin D     Follow up vit d deficiency   . Hypertension  . Gastroesophageal Reflux  . Hypothyroidism   HYPERTENSION Hypertension status: controlled  Satisfied with current treatment? yes Duration of hypertension: chronic BP monitoring frequency:  not checking BP medication side effects:  no Medication compliance: excellent compliance Previous BP meds: losartan Aspirin: no Recurrent headaches: no Visual changes: no Palpitations: no Dyspnea: no Chest pain: no Lower extremity edema: no Dizzy/lightheaded: no  HYPOTHYROIDISM Thyroid control status:controlled Satisfied with current treatment? yes Medication side effects: no Medication compliance: excellent compliance Recent dose adjustment:no Fatigue: no Cold intolerance: no Heat intolerance: no Weight gain: no Weight loss: no Constipation: no Diarrhea/loose stools: no Palpitations: no Lower extremity edema: no Anxiety/depressed mood: no  Notes that both his hands and his feet have been hurting for months. If he squeezes his joint, he will occasionally have sharp seering pain. He is wondering if it's arthritis or if something else is going on. No redness, no swelling. No heat. Otherwise feeling well.   Relevant past medical, surgical, family and social history reviewed and updated as indicated. Interim medical history since our last visit reviewed. Allergies and medications reviewed and updated.  Review of Systems  Constitutional: Negative.   Respiratory: Negative.   Cardiovascular: Negative.   Musculoskeletal: Positive for arthralgias. Negative for back pain, gait problem, joint swelling, myalgias, neck pain and neck stiffness.  Neurological:  Negative.   Psychiatric/Behavioral: Negative.     Per HPI unless specifically indicated above     Objective:    BP 137/87   Pulse 66   Wt 274 lb 6.4 oz (124.5 kg)   SpO2 99%   BMI 35.33 kg/m   Wt Readings from Last 3 Encounters:  05/19/20 274 lb 6.4 oz (124.5 kg)  11/26/19 273 lb (123.8 kg)  05/07/19 275 lb 12.8 oz (125.1 kg)    Physical Exam Vitals and nursing note reviewed.  Constitutional:      General: He is not in acute distress.    Appearance: Normal appearance. He is not ill-appearing, toxic-appearing or diaphoretic.  HENT:     Head: Normocephalic and atraumatic.     Right Ear: External ear normal.     Left Ear: External ear normal.     Nose: Nose normal.     Mouth/Throat:     Mouth: Mucous membranes are moist.     Pharynx: Oropharynx is clear.  Eyes:     General: No scleral icterus.       Right eye: No discharge.        Left eye: No discharge.     Extraocular Movements: Extraocular movements intact.     Conjunctiva/sclera: Conjunctivae normal.     Pupils: Pupils are equal, round, and reactive to light.  Cardiovascular:     Rate and Rhythm: Normal rate and regular rhythm.     Pulses: Normal pulses.     Heart sounds: Normal heart sounds. No murmur heard. No friction rub. No gallop.   Pulmonary:     Effort: Pulmonary effort is normal. No respiratory distress.     Breath sounds: Normal  breath sounds. No stridor. No wheezing, rhonchi or rales.  Chest:     Chest wall: No tenderness.  Musculoskeletal:        General: Normal range of motion.     Cervical back: Normal range of motion and neck supple.  Skin:    General: Skin is warm and dry.     Capillary Refill: Capillary refill takes less than 2 seconds.     Coloration: Skin is not jaundiced or pale.     Findings: No bruising, erythema, lesion or rash.  Neurological:     General: No focal deficit present.     Mental Status: He is alert and oriented to person, place, and time. Mental status is at baseline.   Psychiatric:        Mood and Affect: Mood normal.        Behavior: Behavior normal.        Thought Content: Thought content normal.        Judgment: Judgment normal.     Results for orders placed or performed in visit on 11/26/19  Comprehensive metabolic panel  Result Value Ref Range   Glucose 120 (H) 65 - 99 mg/dL   BUN 15 8 - 27 mg/dL   Creatinine, Ser 0.79 0.76 - 1.27 mg/dL   GFR calc non Af Amer 97 >59 mL/min/1.73   GFR calc Af Amer 112 >59 mL/min/1.73   BUN/Creatinine Ratio 19 10 - 24   Sodium 137 134 - 144 mmol/L   Potassium 4.5 3.5 - 5.2 mmol/L   Chloride 100 96 - 106 mmol/L   CO2 25 20 - 29 mmol/L   Calcium 9.2 8.6 - 10.2 mg/dL   Total Protein 6.8 6.0 - 8.5 g/dL   Albumin 4.5 3.8 - 4.8 g/dL   Globulin, Total 2.3 1.5 - 4.5 g/dL   Albumin/Globulin Ratio 2.0 1.2 - 2.2   Bilirubin Total 0.6 0.0 - 1.2 mg/dL   Alkaline Phosphatase 76 44 - 121 IU/L   AST 65 (H) 0 - 40 IU/L   ALT 84 (H) 0 - 44 IU/L  CBC With Differential/Platelet  Result Value Ref Range   WBC 9.5 3.4 - 10.8 x10E3/uL   RBC 4.45 4.14 - 5.80 x10E6/uL   Hemoglobin 14.9 13.0 - 17.7 g/dL   Hematocrit 41.2 37.5 - 51.0 %   MCV 93 79 - 97 fL   MCH 33.5 (H) 26.6 - 33.0 pg   MCHC 36.2 (H) 31.5 - 35.7 g/dL   RDW 12.9 11.6 - 15.4 %   Platelets 219 150 - 450 x10E3/uL   Neutrophils 72 Not Estab. %   Lymphs 21 Not Estab. %   MID 7 Not Estab. %   Neutrophils Absolute 6.8 1.4 - 7.0 x10E3/uL   Lymphocytes Absolute 2.0 0.7 - 3.1 x10E3/uL   MID (Absolute) 0.7 0.1 - 1.6 X10E3/uL  TSH  Result Value Ref Range   TSH 3.000 0.450 - 4.500 uIU/mL  VITAMIN D 25 Hydroxy (Vit-D Deficiency, Fractures)  Result Value Ref Range   Vit D, 25-Hydroxy 19.3 (L) 30.0 - 100.0 ng/mL      Assessment & Plan:   Problem List Items Addressed This Visit      Cardiovascular and Mediastinum   Essential hypertension    Under good control on current regimen. Continue current regimen. Continue to monitor. Call with any concerns. Refills  given. Labs drawn today.        Relevant Medications   losartan (COZAAR) 50 MG tablet   Other Relevant  Orders   Basic metabolic panel     Digestive   GERD (gastroesophageal reflux disease)    Under good control on current regimen. Continue current regimen. Continue to monitor. Call with any concerns. Refills given. Labs drawn today.      Relevant Orders   CBC with Differential/Platelet     Endocrine   Hypothyroidism    Rechecking labs today. Await results. Treat as needed.       Relevant Orders   TSH     Other   Vitamin D deficiency - Primary    Rechecking labs today. Await results. Treat as needed.       Relevant Orders   VITAMIN D 25 Hydroxy (Vit-D Deficiency, Fractures)    Other Visit Diagnoses    Bilateral hand pain       Will obtain x-rays. Await results. May use voltren gel. Call with any concerns.    Relevant Orders   DG Hand Complete Left   DG Hand Complete Right   Bilateral foot pain       Will obtain x-rays. Await results. May use voltren gel. Call with any concerns.    Relevant Orders   DG Foot Complete Left   DG Foot Complete Right       Follow up plan: Return in about 6 months (around 11/18/2020) for physical.

## 2020-05-20 LAB — BASIC METABOLIC PANEL
BUN/Creatinine Ratio: 24 (ref 10–24)
BUN: 17 mg/dL (ref 8–27)
CO2: 24 mmol/L (ref 20–29)
Calcium: 8.8 mg/dL (ref 8.6–10.2)
Chloride: 102 mmol/L (ref 96–106)
Creatinine, Ser: 0.7 mg/dL — ABNORMAL LOW (ref 0.76–1.27)
Glucose: 109 mg/dL — ABNORMAL HIGH (ref 65–99)
Potassium: 4.6 mmol/L (ref 3.5–5.2)
Sodium: 143 mmol/L (ref 134–144)
eGFR: 104 mL/min/{1.73_m2} (ref >59)

## 2020-05-20 LAB — CBC WITH DIFFERENTIAL/PLATELET
Basophils Absolute: 0.1 10*3/uL (ref 0.0–0.2)
Basos: 1 %
EOS (ABSOLUTE): 0.2 10*3/uL (ref 0.0–0.4)
Eos: 3 %
Hematocrit: 43.4 % (ref 37.5–51.0)
Hemoglobin: 15.1 g/dL (ref 13.0–17.7)
Immature Grans (Abs): 0 10*3/uL (ref 0.0–0.1)
Immature Granulocytes: 0 %
Lymphocytes Absolute: 1.7 10*3/uL (ref 0.7–3.1)
Lymphs: 24 %
MCH: 33.4 pg — ABNORMAL HIGH (ref 26.6–33.0)
MCHC: 34.8 g/dL (ref 31.5–35.7)
MCV: 96 fL (ref 79–97)
Monocytes Absolute: 0.7 10*3/uL (ref 0.1–0.9)
Monocytes: 10 %
Neutrophils Absolute: 4.6 10*3/uL (ref 1.4–7.0)
Neutrophils: 62 %
Platelets: 220 10*3/uL (ref 150–450)
RBC: 4.52 x10E6/uL (ref 4.14–5.80)
RDW: 12.8 % (ref 11.6–15.4)
WBC: 7.3 10*3/uL (ref 3.4–10.8)

## 2020-05-20 LAB — VITAMIN D 25 HYDROXY (VIT D DEFICIENCY, FRACTURES): Vit D, 25-Hydroxy: 30.5 ng/mL (ref 30.0–100.0)

## 2020-05-20 LAB — TSH: TSH: 3.54 u[IU]/mL (ref 0.450–4.500)

## 2020-05-23 ENCOUNTER — Encounter: Payer: Self-pay | Admitting: Family Medicine

## 2020-05-23 ENCOUNTER — Other Ambulatory Visit: Payer: Self-pay | Admitting: Family Medicine

## 2020-11-09 ENCOUNTER — Other Ambulatory Visit: Payer: Self-pay | Admitting: Family Medicine

## 2020-11-09 NOTE — Telephone Encounter (Signed)
Requested Prescriptions  Pending Prescriptions Disp Refills  . levothyroxine (SYNTHROID) 75 MCG tablet [Pharmacy Med Name: LEVOTHYROXINE 75 MCG TABLET] 90 tablet 1    Sig: TAKE 1 TABLET BY MOUTH EVERY DAY     Endocrinology:  Hypothyroid Agents Failed - 11/09/2020  1:25 AM      Failed - TSH needs to be rechecked within 3 months after an abnormal result. Refill until TSH is due.      Passed - TSH in normal range and within 360 days    TSH  Date Value Ref Range Status  05/19/2020 3.540 0.450 - 4.500 uIU/mL Final         Passed - Valid encounter within last 12 months    Recent Outpatient Visits          5 months ago Vitamin D deficiency   [place], Megan P, DO   11 months ago Essential hypertension   [place], [place], DO   1 year ago Essential hypertension   [place], [place], DO   1 year ago Essential hypertension   [place], Megan P, DO   1 year ago Routine general medical examination at a health care facility   [facility], [place], DO      Future Appointments            In 2 weeks Wynetta Emery, Megan P, DO [facility]           . losartan (COZAAR) 50 MG tablet [Pharmacy Med Name: LOSARTAN POTASSIUM 50 MG TAB] 90 tablet 1    Sig: TAKE 1 TABLET BY MOUTH EVERY DAY     Cardiovascular:  Angiotensin Receptor Blockers Failed - 11/09/2020  1:25 AM      Failed - Cr in normal range and within 180 days    Creatinine, Ser  Date Value Ref Range Status  05/19/2020 0.70 (L) 0.76 - 1.27 mg/dL Final         Passed - K in normal range and within 180 days    Potassium  Date Value Ref Range Status  05/19/2020 4.6 3.5 - 5.2 mmol/L Final         Passed - Patient is not pregnant      Passed - Last BP in normal range    BP Readings from Last 1 Encounters:  05/19/20 137/87         Passed - Valid encounter within last 6 months    Recent Outpatient  Visits          5 months ago Vitamin D deficiency   [facility], Megan P, DO   11 months ago Essential hypertension   [place], [place], DO   1 year ago Essential hypertension   [place], [place], DO   1 year ago Essential hypertension   [place], Megan P, DO   1 year ago Routine general medical examination at a health care facility   [place], Barb Merino, DO      Future Appointments            In 2 weeks Wynetta Emery, Barb Merino, DO [facility], [facility]

## 2020-11-24 ENCOUNTER — Ambulatory Visit (INDEPENDENT_AMBULATORY_CARE_PROVIDER_SITE_OTHER): Payer: Managed Care, Other (non HMO) | Admitting: Family Medicine

## 2020-11-24 ENCOUNTER — Encounter: Payer: Self-pay | Admitting: Family Medicine

## 2020-11-24 ENCOUNTER — Other Ambulatory Visit: Payer: Self-pay

## 2020-11-24 VITALS — BP 151/90 | HR 69 | Temp 98.2°F | Ht 73.9 in | Wt 294.2 lb

## 2020-11-24 DIAGNOSIS — R3911 Hesitancy of micturition: Secondary | ICD-10-CM

## 2020-11-24 DIAGNOSIS — E039 Hypothyroidism, unspecified: Secondary | ICD-10-CM

## 2020-11-24 DIAGNOSIS — Z0000 Encounter for general adult medical examination without abnormal findings: Secondary | ICD-10-CM

## 2020-11-24 DIAGNOSIS — K219 Gastro-esophageal reflux disease without esophagitis: Secondary | ICD-10-CM | POA: Diagnosis not present

## 2020-11-24 DIAGNOSIS — E559 Vitamin D deficiency, unspecified: Secondary | ICD-10-CM | POA: Diagnosis not present

## 2020-11-24 DIAGNOSIS — Z136 Encounter for screening for cardiovascular disorders: Secondary | ICD-10-CM

## 2020-11-24 DIAGNOSIS — I10 Essential (primary) hypertension: Secondary | ICD-10-CM | POA: Diagnosis not present

## 2020-11-24 DIAGNOSIS — R748 Abnormal levels of other serum enzymes: Secondary | ICD-10-CM

## 2020-11-24 DIAGNOSIS — Z1211 Encounter for screening for malignant neoplasm of colon: Secondary | ICD-10-CM

## 2020-11-24 LAB — URINALYSIS, ROUTINE W REFLEX MICROSCOPIC
Bilirubin, UA: NEGATIVE
Glucose, UA: NEGATIVE
Ketones, UA: NEGATIVE
Leukocytes,UA: NEGATIVE
Nitrite, UA: NEGATIVE
Protein,UA: NEGATIVE
RBC, UA: NEGATIVE
Specific Gravity, UA: 1.02 (ref 1.005–1.030)
Urobilinogen, Ur: 0.2 mg/dL (ref 0.2–1.0)
pH, UA: 7 (ref 5.0–7.5)

## 2020-11-24 LAB — MICROALBUMIN, URINE WAIVED
Creatinine, Urine Waived: 300 mg/dL (ref 10–300)
Microalb, Ur Waived: 30 mg/L — ABNORMAL HIGH (ref 0–19)
Microalb/Creat Ratio: <30 mg/g (ref <30)

## 2020-11-24 MED ORDER — LOSARTAN POTASSIUM 100 MG PO TABS: 100.0000 mg | ORAL_TABLET | Freq: Every day | ORAL | 1 refills | Status: DC

## 2020-11-24 MED ORDER — IBUPROFEN 800 MG PO TABS: 800.0000 mg | ORAL_TABLET | Freq: Three times a day (TID) | ORAL | 1 refills | Status: DC | PRN

## 2020-11-24 NOTE — Progress Notes (Signed)
BP (!) 151/90   Pulse 69   Temp 98.2 F (36.8 C) (Oral)   Ht 6' 1.9" (1.877 m)   Wt 294 lb 3.2 oz (133.4 kg)   SpO2 98%   BMI 37.88 kg/m    Subjective:    Patient ID: Nicholas Wall, male    DOB: [birth year], 65 y.o.   MRN: [id number]  HPI: Nicholas Wall is a 65 y.o. male presenting on 11/24/2020 for comprehensive medical examination. Current medical complaints include:  ARTHRALGIAS / JOINT ACHES Duration: 6 months Pain: yes Symmetric: no Status 6/10 Quality: aching and dull Frequency: intermittent- worse in the morning Context:  worse Decreased function/range of motion: yes  Erythema: no Swelling: no Heat or warmth: no Morning stiffness: yes Aggravating factors: moving his arms  Alleviating factors: heat, ibuprofen Relief with NSAIDs?: significant Treatments attempted:  rest, ice, heat, APAP, and ibuprofen  Involved Joints:     Hands: yes bilateral    Wrists: yes right     Elbows: yes bilateral    Shoulders: yes bilateral    Back: yes     Hips: yes right>left    Knees: yes bilateral    Ankles: yes bilateral    Feet: yes bilateral  HYPERTENSION Hypertension status: uncontrolled  Satisfied with current treatment? no Duration of hypertension: chronic BP monitoring frequency:  not checking BP medication side effects:  no Medication compliance: excellent compliance Previous BP meds: losartan Aspirin: no Recurrent headaches: no Visual changes: no Palpitations: no Dyspnea: no Chest pain: no Lower extremity edema: no Dizzy/lightheaded: no  HYPOTHYROIDISM Thyroid control status:stable Satisfied with current treatment? yes Medication side effects: no Medication compliance: excellent compliance Recent dose adjustment:no Fatigue: no Cold intolerance: no Heat intolerance: no Weight gain: no Weight loss: no Constipation: no Diarrhea/loose stools: no Palpitations: no Lower extremity edema: no Anxiety/depressed mood: no  He currently lives with:  wife Interim Problems from his last visit: no  Depression Screen done today and results listed below:  Depression screen [facility] 2/9 11/24/2020 11/26/2019 01/01/2019 12/26/2017 11/28/2017  Decreased Interest 0 0 0 1 1  Down, Depressed, Hopeless 0 0 0 0 0  PHQ - 2 Score 0 0 0 1 1  Altered sleeping 0 - - 0 -  Tired, decreased energy 0 - - 1 -  Change in appetite 0 - - 1 -  Feeling bad or failure about yourself  0 - - 0 -  Trouble concentrating 0 - - 0 -  Moving slowly or fidgety/restless 0 - - 0 -  Suicidal thoughts 0 - - 0 -  PHQ-9 Score 0 - - 3 -  Difficult doing work/chores Not difficult at all - - Not difficult at all -    Past Medical History:  Past Medical History:  Diagnosis Date   Allergic rhinitis    GERD (gastroesophageal reflux disease)    HTN (hypertension)    Left inguinal hernia 07/2009    Surgical History:  Past Surgical History:  Procedure Laterality Date   BACK SURGERY     1988-- L4-L5 laminectomy   HERNIA REPAIR Left 2011    Medications:  Current Outpatient Medications on File Prior to Visit  Medication Sig   levothyroxine (SYNTHROID) 75 MCG tablet TAKE 1 TABLET BY MOUTH EVERY DAY   omeprazole (PRILOSEC) 20 MG capsule TAKE 1 CAPSULE BY MOUTH EVERY DAY   No current facility-administered medications on file prior to visit.    Allergies:  No Known Allergies  Social History:  Social History  Socioeconomic History   Marital status: Married    Spouse name: Not on file   Number of children: Not on file   Years of education: Not on file   Highest education level: Not on file  Occupational History   Not on file  Tobacco Use   Smoking status: Never   Smokeless tobacco: Never  Vaping Use   Vaping Use: Never used  Substance and Sexual Activity   Alcohol use: Yes    Comment: daily   Drug use: No   Sexual activity: Not on file  Other Topics Concern   Not on file  Social History Narrative   Not on file   Social Determinants of Health   Financial  Resource Strain: Not on file  Food Insecurity: Not on file  Transportation Needs: Not on file  Physical Activity: Not on file  Stress: Not on file  Social Connections: Not on file  Intimate Partner Violence: Not on file   Social History   Tobacco Use  Smoking Status Never  Smokeless Tobacco Never   Social History   Substance and Sexual Activity  Alcohol Use Yes   Comment: daily    Family History:  Family History  Problem Relation Age of Onset   Hypertension Mother    Hyperlipidemia Mother    Diabetes Mother    Arthritis Mother    Vision loss Mother    Thyroid disease Mother    Cancer Father    Hyperlipidemia Father    Arthritis Father    Heart disease Father    Thyroid disease Brother    Hyperlipidemia Maternal Grandmother    Hypertension Maternal Grandmother    Alzheimer's disease Maternal Grandmother    Heart disease Maternal Grandfather    Hyperlipidemia Maternal Grandfather    Hypertension Maternal Grandfather    Cancer Paternal Grandmother    Thyroid disease Brother     Past medical history, surgical history, medications, allergies, family history and social history reviewed with patient today and changes made to appropriate areas of the chart.   Review of Systems  Constitutional: Negative.   HENT:  Positive for hearing loss. Negative for congestion, ear discharge, ear pain, nosebleeds, sinus pain, sore throat and tinnitus.   Eyes: Negative.   Respiratory: Negative.  Negative for stridor.        +stridor  Cardiovascular: Negative.   Gastrointestinal: Negative.   Genitourinary: Negative.        +dark urine  Musculoskeletal:  Positive for back pain, joint pain and myalgias. Negative for falls and neck pain.  Skin: Negative.   Neurological: Negative.   Endo/Heme/Allergies: Negative.   Psychiatric/Behavioral: Negative.    All other ROS negative except what is listed above and in the HPI.      Objective:    BP (!) 151/90   Pulse 69   Temp 98.2 F  (36.8 C) (Oral)   Ht 6' 1.9" (1.877 m)   Wt 294 lb 3.2 oz (133.4 kg)   SpO2 98%   BMI 37.88 kg/m   Wt Readings from Last 3 Encounters:  11/24/20 294 lb 3.2 oz (133.4 kg)  05/19/20 274 lb 6.4 oz (124.5 kg)  11/26/19 273 lb (123.8 kg)    Physical Exam Vitals and nursing note reviewed.  Constitutional:      General: He is not in acute distress.    Appearance: Normal appearance. He is obese. He is not ill-appearing, toxic-appearing or diaphoretic.  HENT:     Head: Normocephalic and atraumatic.  Right Ear: Tympanic membrane, ear canal and external ear normal. There is no impacted cerumen.     Left Ear: Tympanic membrane, ear canal and external ear normal. There is no impacted cerumen.     Nose: Nose normal. No congestion or rhinorrhea.     Mouth/Throat:     Mouth: Mucous membranes are moist.     Pharynx: Oropharynx is clear. No oropharyngeal exudate or posterior oropharyngeal erythema.  Eyes:     General: No scleral icterus.       Right eye: No discharge.        Left eye: No discharge.     Extraocular Movements: Extraocular movements intact.     Conjunctiva/sclera: Conjunctivae normal.     Pupils: Pupils are equal, round, and reactive to light.  Neck:     Vascular: No carotid bruit.  Cardiovascular:     Rate and Rhythm: Normal rate and regular rhythm.     Pulses: Normal pulses.     Heart sounds: No murmur heard.   No friction rub. No gallop.  Pulmonary:     Effort: Pulmonary effort is normal. No respiratory distress.     Breath sounds: Normal breath sounds. No stridor. No wheezing, rhonchi or rales.  Chest:     Chest wall: No tenderness.  Abdominal:     General: Abdomen is flat. Bowel sounds are normal. There is no distension.     Palpations: Abdomen is soft. There is no mass.     Tenderness: There is no abdominal tenderness. There is no right CVA tenderness, left CVA tenderness, guarding or rebound.     Hernia: No hernia is present.  Genitourinary:    Comments:  Genital exam deferred with shared decision making Musculoskeletal:        General: No swelling, tenderness, deformity or signs of injury.     Cervical back: Normal range of motion and neck supple. No rigidity. No muscular tenderness.     Right lower leg: No edema.     Left lower leg: No edema.  Lymphadenopathy:     Cervical: No cervical adenopathy.  Skin:    General: Skin is warm and dry.     Capillary Refill: Capillary refill takes less than 2 seconds.     Coloration: Skin is not jaundiced or pale.     Findings: No bruising, erythema, lesion or rash.  Neurological:     General: No focal deficit present.     Mental Status: He is alert and oriented to person, place, and time.     Cranial Nerves: No cranial nerve deficit.     Sensory: No sensory deficit.     Motor: No weakness.     Coordination: Coordination normal.     Gait: Gait normal.     Deep Tendon Reflexes: Reflexes normal.  Psychiatric:        Mood and Affect: Mood normal.        Behavior: Behavior normal.        Thought Content: Thought content normal.        Judgment: Judgment normal.    Results for orders placed or performed in visit on 11/24/20  Comprehensive metabolic panel  Result Value Ref Range   Glucose 128 (H) 70 - 99 mg/dL   BUN 16 8 - 27 mg/dL   Creatinine, Ser 0.80 0.76 - 1.27 mg/dL   eGFR 100 >59 mL/min/1.73   BUN/Creatinine Ratio 20 10 - 24   Sodium 141 134 - 144 mmol/L   Potassium 4.7  3.5 - 5.2 mmol/L   Chloride 101 96 - 106 mmol/L   CO2 27 20 - 29 mmol/L   Calcium 9.4 8.6 - 10.2 mg/dL   Total Protein 7.4 6.0 - 8.5 g/dL   Albumin 4.9 (H) 3.8 - 4.8 g/dL   Globulin, Total 2.5 1.5 - 4.5 g/dL   Albumin/Globulin Ratio 2.0 1.2 - 2.2   Bilirubin Total 0.5 0.0 - 1.2 mg/dL   Alkaline Phosphatase 81 44 - 121 IU/L   AST 48 (H) 0 - 40 IU/L   ALT 66 (H) 0 - 44 IU/L  CBC with Differential/Platelet  Result Value Ref Range   WBC 8.9 3.4 - 10.8 x10E3/uL   RBC 4.64 4.14 - 5.80 x10E6/uL   Hemoglobin 15.6 13.0  - 17.7 g/dL   Hematocrit 43.7 37.5 - 51.0 %   MCV 94 79 - 97 fL   MCH 33.6 (H) 26.6 - 33.0 pg   MCHC 35.7 31.5 - 35.7 g/dL   RDW 12.4 11.6 - 15.4 %   Platelets 253 150 - 450 x10E3/uL   Neutrophils 60 Not Estab. %   Lymphs 27 Not Estab. %   Monocytes 9 Not Estab. %   Eos 3 Not Estab. %   Basos 1 Not Estab. %   Neutrophils Absolute 5.4 1.4 - 7.0 x10E3/uL   Lymphocytes Absolute 2.4 0.7 - 3.1 x10E3/uL   Monocytes Absolute 0.8 0.1 - 0.9 x10E3/uL   EOS (ABSOLUTE) 0.3 0.0 - 0.4 x10E3/uL   Basophils Absolute 0.1 0.0 - 0.2 x10E3/uL   Immature Granulocytes 0 Not Estab. %   Immature Grans (Abs) 0.0 0.0 - 0.1 x10E3/uL  Lipid Panel w/o Chol/HDL Ratio  Result Value Ref Range   Cholesterol, Total 164 100 - 199 mg/dL   Triglycerides 92 0 - 149 mg/dL   HDL 62 >39 mg/dL   VLDL Cholesterol Cal 17 5 - 40 mg/dL   LDL Chol Calc (NIH) 85 0 - 99 mg/dL  PSA  Result Value Ref Range   Prostate Specific Ag, Serum 1.1 0.0 - 4.0 ng/mL  TSH  Result Value Ref Range   TSH 4.630 (H) 0.450 - 4.500 uIU/mL  Urinalysis, Routine w reflex microscopic  Result Value Ref Range   Specific Gravity, UA 1.020 1.005 - 1.030   pH, UA 7.0 5.0 - 7.5   Color, UA Yellow Yellow   Appearance Ur Clear Clear   Leukocytes,UA Negative Negative   Protein,UA Negative Negative/Trace   Glucose, UA Negative Negative   Ketones, UA Negative Negative   RBC, UA Negative Negative   Bilirubin, UA Negative Negative   Urobilinogen, Ur 0.2 0.2 - 1.0 mg/dL   Nitrite, UA Negative Negative  Microalbumin, Urine Waived  Result Value Ref Range   Microalb, Ur Waived 30 (H) 0 - 19 mg/L   Creatinine, Urine Waived 300 10 - 300 mg/dL   Microalb/Creat Ratio <30 <30 mg/g  CK  Result Value Ref Range   Total CK 300 41 - 331 U/L      Assessment & Plan:   Problem List Items Addressed This Visit       Cardiovascular and Mediastinum   Essential hypertension    Running high. Will increase his losartan to 176m and recheck 1 month. Call with any  concerns. Continue to monitor.       Relevant Medications   losartan (COZAAR) 100 MG tablet   Other Relevant Orders   Comprehensive metabolic panel (Completed)   CBC with Differential/Platelet (Completed)   Urinalysis, Routine w  reflex microscopic (Completed)   Microalbumin, Urine Waived (Completed)     Digestive   GERD (gastroesophageal reflux disease)    Under good control on current regimen. Continue current regimen. Continue to monitor. Call with any concerns. Refills given. Labs drawn today.       Relevant Orders   Comprehensive metabolic panel (Completed)   CBC with Differential/Platelet (Completed)     Endocrine   Hypothyroidism    Rechecking labs today. Await results. Treat as needed.       Relevant Orders   Comprehensive metabolic panel (Completed)   CBC with Differential/Platelet (Completed)   TSH (Completed)     Other   Elevated CK    Rechecking labs today. Await results. Treat as needed.       Relevant Orders   CK (Creatine Kinase)   Vitamin D deficiency    Rechecking labs today. Await results. Treat as needed.       Relevant Orders   Comprehensive metabolic panel (Completed)   CBC with Differential/Platelet (Completed)   Other Visit Diagnoses     Routine general medical examination at a health care facility    -  Primary   Vaccines up to date/declined. Screening labs checked today. Colonscopy ordered. Continue diet and exercise. Call with any concerns. Continuue to monitor.    Hesitancy       Rechecking labs today. Await results. Treat as needed.    Relevant Orders   PSA (Completed)   Screening for cardiovascular condition       Rechecking labs today. Await results. Treat as needed.    Relevant Orders   Lipid Panel w/o Chol/HDL Ratio (Completed)   Screening for colon cancer       Referral to GI made today.   Relevant Orders   Ambulatory referral to Gastroenterology        Discussed aspirin prophylaxis for myocardial infarction prevention  and decision was it was not indicated  LABORATORY TESTING:  Health maintenance labs ordered today as discussed above.   The natural history of prostate cancer and ongoing controversy regarding screening and potential treatment outcomes of prostate cancer has been discussed with the patient. The meaning of a false positive PSA and a false negative PSA has been discussed. He indicates understanding of the limitations of this screening test and wishes to proceed with screening PSA testing.   IMMUNIZATIONS:   - Tdap: Tetanus vaccination status reviewed: last tetanus booster 4 years ago. - Influenza: Refused - Pneumovax: Not applicable - Prevnar: Not applicable - COVID: Refused - Shingrix vaccine: Refused  SCREENING: - Colonoscopy: Ordered today  Discussed with patient purpose of the colonoscopy is to detect colon cancer at curable precancerous or early stages   PATIENT COUNSELING:    Sexuality: Discussed sexually transmitted diseases, partner selection, use of condoms, avoidance of unintended pregnancy  and contraceptive alternatives.   Advised to avoid cigarette smoking.  I discussed with the patient that most people either abstain from alcohol or drink within safe limits (<=14/week and <=4 drinks/occasion for males, <=7/weeks and <= 3 drinks/occasion for females) and that the risk for alcohol disorders and other health effects rises proportionally with the number of drinks per week and how often a drinker exceeds daily limits.  Discussed cessation/primary prevention of drug use and availability of treatment for abuse.   Diet: Encouraged to adjust caloric intake to maintain  or achieve ideal body weight, to reduce intake of dietary saturated fat and total fat, to limit sodium intake by avoiding high  sodium foods and not adding table salt, and to maintain adequate dietary potassium and calcium preferably from fresh fruits, vegetables, and low-fat dairy products.    stressed the importance  of regular exercise  Injury prevention: Discussed safety belts, safety helmets, smoke detector, smoking near bedding or upholstery.   Dental health: Discussed importance of regular tooth brushing, flossing, and dental visits.   Follow up plan: NEXT PREVENTATIVE PHYSICAL DUE IN 1 YEAR. Return in about 4 weeks (around 12/22/2020).

## 2020-11-24 NOTE — Assessment & Plan Note (Signed)
Rechecking labs today. Await results. Treat as needed.  °

## 2020-11-24 NOTE — Assessment & Plan Note (Signed)
Under good control on current regimen. Continue current regimen. Continue to monitor. Call with any concerns. Refills given. Labs drawn today.   

## 2020-11-25 LAB — CBC WITH DIFFERENTIAL/PLATELET
Basophils Absolute: 0.1 10*3/uL (ref 0.0–0.2)
Basos: 1 %
EOS (ABSOLUTE): 0.3 10*3/uL (ref 0.0–0.4)
Eos: 3 %
Hematocrit: 43.7 % (ref 37.5–51.0)
Hemoglobin: 15.6 g/dL (ref 13.0–17.7)
Immature Grans (Abs): 0 10*3/uL (ref 0.0–0.1)
Immature Granulocytes: 0 %
Lymphocytes Absolute: 2.4 10*3/uL (ref 0.7–3.1)
Lymphs: 27 %
MCH: 33.6 pg — ABNORMAL HIGH (ref 26.6–33.0)
MCHC: 35.7 g/dL (ref 31.5–35.7)
MCV: 94 fL (ref 79–97)
Monocytes Absolute: 0.8 10*3/uL (ref 0.1–0.9)
Monocytes: 9 %
Neutrophils Absolute: 5.4 10*3/uL (ref 1.4–7.0)
Neutrophils: 60 %
Platelets: 253 10*3/uL (ref 150–450)
RBC: 4.64 x10E6/uL (ref 4.14–5.80)
RDW: 12.4 % (ref 11.6–15.4)
WBC: 8.9 10*3/uL (ref 3.4–10.8)

## 2020-11-25 LAB — COMPREHENSIVE METABOLIC PANEL
ALT: 66 IU/L — ABNORMAL HIGH (ref 0–44)
AST: 48 IU/L — ABNORMAL HIGH (ref 0–40)
Albumin/Globulin Ratio: 2 (ref 1.2–2.2)
Albumin: 4.9 g/dL — ABNORMAL HIGH (ref 3.8–4.8)
Alkaline Phosphatase: 81 IU/L (ref 44–121)
BUN/Creatinine Ratio: 20 (ref 10–24)
BUN: 16 mg/dL (ref 8–27)
Bilirubin Total: 0.5 mg/dL (ref 0.0–1.2)
CO2: 27 mmol/L (ref 20–29)
Calcium: 9.4 mg/dL (ref 8.6–10.2)
Chloride: 101 mmol/L (ref 96–106)
Creatinine, Ser: 0.8 mg/dL (ref 0.76–1.27)
Globulin, Total: 2.5 g/dL (ref 1.5–4.5)
Glucose: 128 mg/dL — ABNORMAL HIGH (ref 70–99)
Potassium: 4.7 mmol/L (ref 3.5–5.2)
Sodium: 141 mmol/L (ref 134–144)
Total Protein: 7.4 g/dL (ref 6.0–8.5)
eGFR: 100 mL/min/{1.73_m2} (ref >59)

## 2020-11-25 LAB — LIPID PANEL W/O CHOL/HDL RATIO
Cholesterol, Total: 164 mg/dL (ref 100–199)
HDL: 62 mg/dL (ref >39)
LDL Chol Calc (NIH): 85 mg/dL (ref 0–99)
Triglycerides: 92 mg/dL (ref 0–149)
VLDL Cholesterol Cal: 17 mg/dL (ref 5–40)

## 2020-11-25 LAB — PSA: Prostate Specific Ag, Serum: 1.1 ng/mL (ref 0.0–4.0)

## 2020-11-25 LAB — TSH: TSH: 4.63 u[IU]/mL — ABNORMAL HIGH (ref 0.450–4.500)

## 2020-11-25 LAB — CK: Total CK: 300 U/L (ref 41–331)

## 2020-11-25 NOTE — Assessment & Plan Note (Signed)
Running high. Will increase his losartan to 100mg  and recheck 1 month. Call with any concerns. Continue to monitor.

## 2020-11-26 ENCOUNTER — Other Ambulatory Visit: Payer: Self-pay | Admitting: Family Medicine

## 2020-11-26 DIAGNOSIS — E039 Hypothyroidism, unspecified: Secondary | ICD-10-CM

## 2020-11-26 MED ORDER — LEVOTHYROXINE SODIUM 88 MCG PO TABS: 88.0000 ug | ORAL_TABLET | Freq: Every day | ORAL | 1 refills | Status: DC

## 2020-12-15 ENCOUNTER — Other Ambulatory Visit: Payer: Self-pay

## 2020-12-15 MED ORDER — NA SULFATE-K SULFATE-MG SULF 17.5-3.13-1.6 GM/177ML PO SOLN: 1.0000 | ORAL | 0 refills | Status: DC

## 2020-12-19 ENCOUNTER — Other Ambulatory Visit: Payer: Self-pay | Admitting: Family Medicine

## 2020-12-20 NOTE — Telephone Encounter (Signed)
Requested medications are due for refill today.  yes  Requested medications are on the active medications list.  yes  Last refill. 11/26/2020  Future visit scheduled.   yes  Notes to clinic.  Failed protocol d/t abnormal labs.

## 2020-12-20 NOTE — Telephone Encounter (Signed)
Needs blood work

## 2020-12-29 ENCOUNTER — Encounter: Payer: Self-pay | Admitting: Family Medicine

## 2020-12-29 ENCOUNTER — Ambulatory Visit (INDEPENDENT_AMBULATORY_CARE_PROVIDER_SITE_OTHER): Payer: Managed Care, Other (non HMO) | Admitting: Family Medicine

## 2020-12-29 ENCOUNTER — Other Ambulatory Visit: Payer: Self-pay

## 2020-12-29 VITALS — BP 133/81 | HR 67 | Temp 97.9°F | Wt 270.4 lb

## 2020-12-29 DIAGNOSIS — I10 Essential (primary) hypertension: Secondary | ICD-10-CM

## 2020-12-29 DIAGNOSIS — E039 Hypothyroidism, unspecified: Secondary | ICD-10-CM

## 2020-12-29 DIAGNOSIS — M1990 Unspecified osteoarthritis, unspecified site: Secondary | ICD-10-CM | POA: Diagnosis not present

## 2020-12-29 DIAGNOSIS — J069 Acute upper respiratory infection, unspecified: Secondary | ICD-10-CM

## 2020-12-29 MED ORDER — PREDNISONE 50 MG PO TABS: 50.0000 mg | ORAL_TABLET | Freq: Every day | ORAL | 0 refills | Status: DC

## 2020-12-29 MED ORDER — OMEPRAZOLE 20 MG PO CPDR: DELAYED_RELEASE_CAPSULE | ORAL | 3 refills | Status: DC

## 2020-12-29 NOTE — Assessment & Plan Note (Signed)
Under good control on current regimen. Continue current regimen. Continue to monitor. Call with any concerns. Refills given. Labs drawn today.   

## 2020-12-29 NOTE — Assessment & Plan Note (Signed)
Rechecking labs today. Await results. Treat as needed.  °

## 2020-12-29 NOTE — Progress Notes (Signed)
BP 133/81   Pulse 67   Temp 97.9 F (36.6 C)   Wt 270 lb 6.4 oz (122.7 kg)   SpO2 99%   BMI 34.81 kg/m    Subjective:    Patient ID: Nicholas Wall, male    DOB: 03-13-1955, 65 y.o.   MRN: [id number]  HPI: Nicholas Wall is a 65 y.o. male  Chief Complaint  Patient presents with   Hypertension    Follow up one month    Hypothyroidism   HYPERTENSION Hypertension status: controlled  Satisfied with current treatment? yes Duration of hypertension: chronic BP monitoring frequency:  not checking BP medication side effects:  no Medication compliance: excellent compliance Previous BP meds:losartan Aspirin: no Recurrent headaches: no Visual changes: no Palpitations: no Dyspnea: no Chest pain: no Lower extremity edema: no Dizzy/lightheaded: no  HYPOTHYROIDISM Thyroid control status:stable Satisfied with current treatment? yes Medication side effects: no Medication compliance: excellent compliance Recent dose adjustment:yes Fatigue: no Cold intolerance: no Heat intolerance: no Weight gain: no Weight loss: no Constipation: no Diarrhea/loose stools: no Palpitations: no Lower extremity edema: no Anxiety/depressed mood: no  ARTHRALGIAS / JOINT ACHES Duration: months Pain: yes Symmetric: no  Status: severe Quality: aching and sore Frequency: intermittent Context:  worse Decreased function/range of motion: yes  Erythema: no Swelling: no Heat or warmth: no Morning stiffness: yes Relief with NSAIDs?: mild Treatments attempted:  ibuprofen  Involved Joints:     Hands: yes bilateral    Wrists: yes bilateral     Elbows: yes bilateral    Shoulders: yes right    Back: yes     Hips: yes bilateral    Knees: yes bilateral    Ankles: no     Feet: yes bilateral  UPPER RESPIRATORY TRACT INFECTION Duration: 2-3 weeks Worst symptom: cough  Fever: no Cough: yes Shortness of breath: no Wheezing: yes Chest pain: no Chest tightness: no Chest congestion:  yes Nasal congestion: no Runny nose: no Post nasal drip: no Sneezing: no Sore throat: no Swollen glands: no Sinus pressure: yes Headache: no Face pain: no Toothache: no Ear pain: no  Ear pressure: no  Eyes red/itching:no Eye drainage/crusting: no  Vomiting: no Rash: no Fatigue: yes Sick contacts: no Strep contacts: no  Context: better Recurrent sinusitis: no Relief with OTC cold/cough medications: no  Treatments attempted: none   Relevant past medical, surgical, family and social history reviewed and updated as indicated. Interim medical history since our last visit reviewed. Allergies and medications reviewed and updated.  Review of Systems  Constitutional: Negative.   Respiratory: Negative.    Cardiovascular: Negative.   Musculoskeletal:  Positive for arthralgias, back pain and joint swelling. Negative for gait problem, myalgias, neck pain and neck stiffness.  Skin: Negative.   Psychiatric/Behavioral: Negative.     Per HPI unless specifically indicated above     Objective:    BP 133/81   Pulse 67   Temp 97.9 F (36.6 C)   Wt 270 lb 6.4 oz (122.7 kg)   SpO2 99%   BMI 34.81 kg/m   Wt Readings from Last 3 Encounters:  12/29/20 270 lb 6.4 oz (122.7 kg)  11/24/20 294 lb 3.2 oz (133.4 kg)  05/19/20 274 lb 6.4 oz (124.5 kg)    Physical Exam Vitals and nursing note reviewed.  Constitutional:      General: He is not in acute distress.    Appearance: Normal appearance. He is not ill-appearing, toxic-appearing or diaphoretic.  HENT:     Head: Normocephalic and  atraumatic.     Right Ear: External ear normal.     Left Ear: External ear normal.     Nose: Nose normal.     Mouth/Throat:     Mouth: Mucous membranes are moist.     Pharynx: Oropharynx is clear.  Eyes:     General: No scleral icterus.       Right eye: No discharge.        Left eye: No discharge.     Extraocular Movements: Extraocular movements intact.     Conjunctiva/sclera: Conjunctivae normal.      Pupils: Pupils are equal, round, and reactive to light.  Cardiovascular:     Rate and Rhythm: Normal rate and regular rhythm.     Pulses: Normal pulses.     Heart sounds: Normal heart sounds. No murmur heard.   No friction rub. No gallop.  Pulmonary:     Effort: Pulmonary effort is normal. No respiratory distress.     Breath sounds: Normal breath sounds. No stridor. No wheezing, rhonchi or rales.  Chest:     Chest wall: No tenderness.  Musculoskeletal:        General: Normal range of motion.     Cervical back: Normal range of motion and neck supple.  Skin:    General: Skin is warm and dry.     Capillary Refill: Capillary refill takes less than 2 seconds.     Coloration: Skin is not jaundiced or pale.     Findings: No bruising, erythema, lesion or rash.  Neurological:     General: No focal deficit present.     Mental Status: He is alert and oriented to person, place, and time. Mental status is at baseline.  Psychiatric:        Mood and Affect: Mood normal.        Behavior: Behavior normal.        Thought Content: Thought content normal.        Judgment: Judgment normal.    Results for orders placed or performed in visit on 11/24/20  Comprehensive metabolic panel  Result Value Ref Range   Glucose 128 (H) 70 - 99 mg/dL   BUN 16 8 - 27 mg/dL   Creatinine, Ser 0.80 0.76 - 1.27 mg/dL   eGFR 100 >59 mL/min/1.73   BUN/Creatinine Ratio 20 10 - 24   Sodium 141 134 - 144 mmol/L   Potassium 4.7 3.5 - 5.2 mmol/L   Chloride 101 96 - 106 mmol/L   CO2 27 20 - 29 mmol/L   Calcium 9.4 8.6 - 10.2 mg/dL   Total Protein 7.4 6.0 - 8.5 g/dL   Albumin 4.9 (H) 3.8 - 4.8 g/dL   Globulin, Total 2.5 1.5 - 4.5 g/dL   Albumin/Globulin Ratio 2.0 1.2 - 2.2   Bilirubin Total 0.5 0.0 - 1.2 mg/dL   Alkaline Phosphatase 81 44 - 121 IU/L   AST 48 (H) 0 - 40 IU/L   ALT 66 (H) 0 - 44 IU/L  CBC with Differential/Platelet  Result Value Ref Range   WBC 8.9 3.4 - 10.8 x10E3/uL   RBC 4.64 4.14 - 5.80  x10E6/uL   Hemoglobin 15.6 13.0 - 17.7 g/dL   Hematocrit 43.7 37.5 - 51.0 %   MCV 94 79 - 97 fL   MCH 33.6 (H) 26.6 - 33.0 pg   MCHC 35.7 31.5 - 35.7 g/dL   RDW 12.4 11.6 - 15.4 %   Platelets 253 150 - 450 x10E3/uL   Neutrophils 60  Not Estab. %   Lymphs 27 Not Estab. %   Monocytes 9 Not Estab. %   Eos 3 Not Estab. %   Basos 1 Not Estab. %   Neutrophils Absolute 5.4 1.4 - 7.0 x10E3/uL   Lymphocytes Absolute 2.4 0.7 - 3.1 x10E3/uL   Monocytes Absolute 0.8 0.1 - 0.9 x10E3/uL   EOS (ABSOLUTE) 0.3 0.0 - 0.4 x10E3/uL   Basophils Absolute 0.1 0.0 - 0.2 x10E3/uL   Immature Granulocytes 0 Not Estab. %   Immature Grans (Abs) 0.0 0.0 - 0.1 x10E3/uL  Lipid Panel w/o Chol/HDL Ratio  Result Value Ref Range   Cholesterol, Total 164 100 - 199 mg/dL   Triglycerides 92 0 - 149 mg/dL   HDL 62 >39 mg/dL   VLDL Cholesterol Cal 17 5 - 40 mg/dL   LDL Chol Calc (NIH) 85 0 - 99 mg/dL  PSA  Result Value Ref Range   Prostate Specific Ag, Serum 1.1 0.0 - 4.0 ng/mL  TSH  Result Value Ref Range   TSH 4.630 (H) 0.450 - 4.500 uIU/mL  Urinalysis, Routine w reflex microscopic  Result Value Ref Range   Specific Gravity, UA 1.020 1.005 - 1.030   pH, UA 7.0 5.0 - 7.5   Color, UA Yellow Yellow   Appearance Ur Clear Clear   Leukocytes,UA Negative Negative   Protein,UA Negative Negative/Trace   Glucose, UA Negative Negative   Ketones, UA Negative Negative   RBC, UA Negative Negative   Bilirubin, UA Negative Negative   Urobilinogen, Ur 0.2 0.2 - 1.0 mg/dL   Nitrite, UA Negative Negative  Microalbumin, Urine Waived  Result Value Ref Range   Microalb, Ur Waived 30 (H) 0 - 19 mg/L   Creatinine, Urine Waived 300 10 - 300 mg/dL   Microalb/Creat Ratio <30 <30 mg/g  CK  Result Value Ref Range   Total CK 300 41 - 331 U/L      Assessment & Plan:   Problem List Items Addressed This Visit       Cardiovascular and Mediastinum   Essential hypertension - Primary    Under good control on current regimen.  Continue current regimen. Continue to monitor. Call with any concerns. Refills given. Labs drawn today.       Relevant Orders   Basic metabolic panel     Endocrine   Hypothyroidism    Rechecking labs today. Await results. Treat as needed.       Relevant Orders   TSH   Other Visit Diagnoses     Arthritis       Will recheck labs and refer to ortho. Call with any concerns. Continue to monitor.    Relevant Medications   predniSONE (DELTASONE) 50 MG tablet   Other Relevant Orders   VITAMIN D 25 Hydroxy (Vit-D Deficiency, Fractures)   ANA+ENA+DNA/DS+Antich+Centr   CK   Rocky mtn spotted fvr abs pnl(IgG+IgM)   Uric acid   Ehrlichia antibody panel   Babesia microti Antibody Panel   Lyme Disease Serology w/Reflex   Ambulatory referral to Orthopedic Surgery   Upper respiratory tract infection, unspecified type       Will treat with prednisone burst. Call with any concerns or if not getting better.        Follow up plan: Return in about 5 months (around 05/29/2021).

## 2020-12-29 NOTE — Patient Instructions (Signed)
[facility] [address] [place], [place] [address] Phone: [phone]

## 2021-01-03 LAB — VITAMIN D 25 HYDROXY (VIT D DEFICIENCY, FRACTURES): Vit D, 25-Hydroxy: 23.1 ng/mL — ABNORMAL LOW (ref 30.0–100.0)

## 2021-01-03 LAB — ROCKY MTN SPOTTED FVR ABS PNL(IGG+IGM)
RMSF IgG: NEGATIVE
RMSF IgM: 0.47 index (ref 0.00–0.89)

## 2021-01-03 LAB — EHRLICHIA ANTIBODY PANEL
E. Chaffeensis (HME) IgM Titer: NEGATIVE
E.Chaffeensis (HME) IgG: NEGATIVE
HGE IgG Titer: NEGATIVE
HGE IgM Titer: NEGATIVE

## 2021-01-03 LAB — BASIC METABOLIC PANEL
BUN/Creatinine Ratio: 20 (ref 10–24)
BUN: 17 mg/dL (ref 8–27)
CO2: 24 mmol/L (ref 20–29)
Calcium: 9.1 mg/dL (ref 8.6–10.2)
Chloride: 104 mmol/L (ref 96–106)
Creatinine, Ser: 0.83 mg/dL (ref 0.76–1.27)
Glucose: 142 mg/dL — ABNORMAL HIGH (ref 70–99)
Potassium: 4.6 mmol/L (ref 3.5–5.2)
Sodium: 142 mmol/L (ref 134–144)
eGFR: 98 mL/min/{1.73_m2} (ref >59)

## 2021-01-03 LAB — ANA+ENA+DNA/DS+ANTICH+CENTR
ANA Titer 1: NEGATIVE
Anti JO-1: <0.2 AI (ref 0.0–0.9)
Centromere Ab Screen: <0.2 AI (ref 0.0–0.9)
Chromatin Ab SerPl-aCnc: <0.2 AI (ref 0.0–0.9)
ENA RNP Ab: 1 AI — ABNORMAL HIGH (ref 0.0–0.9)
ENA SM Ab Ser-aCnc: <0.2 AI (ref 0.0–0.9)
ENA SSA (RO) Ab: <0.2 AI (ref 0.0–0.9)
ENA SSB (LA) Ab: <0.2 AI (ref 0.0–0.9)
Scleroderma (Scl-70) (ENA) Antibody, IgG: <0.2 AI (ref 0.0–0.9)
dsDNA Ab: <1 IU/mL (ref 0–9)

## 2021-01-03 LAB — TSH: TSH: 2.91 u[IU]/mL (ref 0.450–4.500)

## 2021-01-03 LAB — BABESIA MICROTI ANTIBODY PANEL
Babesia microti IgG: <1:10 {titer}
Babesia microti IgM: <1:10 {titer}

## 2021-01-03 LAB — LYME DISEASE SEROLOGY W/REFLEX: Lyme Total Antibody EIA: NEGATIVE

## 2021-01-03 LAB — URIC ACID: Uric Acid: 5.6 mg/dL (ref 3.8–8.4)

## 2021-01-03 LAB — CK: Total CK: 125 U/L (ref 41–331)

## 2021-01-11 ENCOUNTER — Other Ambulatory Visit: Payer: Self-pay | Admitting: Family Medicine

## 2021-01-17 ENCOUNTER — Telehealth: Payer: Self-pay | Admitting: Gastroenterology

## 2021-01-17 NOTE — Telephone Encounter (Signed)
Returned pt's call and rescheduled colonoscopy to 02/16/21.

## 2021-01-17 NOTE — Telephone Encounter (Signed)
LVM for pt to return my call.

## 2021-01-17 NOTE — Telephone Encounter (Signed)
Inbound call from pt requesting to r/s his procedure. Thank you.

## 2021-01-28 ENCOUNTER — Other Ambulatory Visit: Payer: Self-pay | Admitting: Family Medicine

## 2021-01-28 NOTE — Telephone Encounter (Signed)
Requested Prescriptions  Pending Prescriptions Disp Refills   ibuprofen (ADVIL) 800 MG tablet [Pharmacy Med Name: IBUPROFEN 800 MG TABLET] 90 tablet 1    Sig: TAKE 1 TABLET BY MOUTH EVERY 8 HOURS AS NEEDED     Analgesics:  NSAIDS Passed - 01/28/2021  9:28 AM      Passed - Cr in normal range and within 360 days    Creatinine, Ser  Date Value Ref Range Status  12/29/2020 0.83 0.76 - 1.27 mg/dL Final         Passed - HGB in normal range and within 360 days    Hemoglobin  Date Value Ref Range Status  11/24/2020 15.6 13.0 - 17.7 g/dL Final         Passed - Patient is not pregnant      Passed - Valid encounter within last 12 months    Recent Outpatient Visits          1 month ago Essential hypertension   [place], [place], DO   2 months ago Routine general medical examination at a health care facility   [facility], Megan P, DO   8 months ago Vitamin D deficiency   [place], [place], DO   1 year ago Essential hypertension   [place], [place], DO   1 year ago Essential hypertension   [place], [place], DO      Future Appointments            In 4 months Johnson, Barb Merino, DO [employer]

## 2021-02-16 ENCOUNTER — Ambulatory Visit
Admission: RE | Admit: 2021-02-16 | Discharge: 2021-02-16 | Disposition: A | Discharge status: Home / Self Care | Payer: Managed Care, Other (non HMO) | Attending: Gastroenterology | Admitting: Gastroenterology

## 2021-02-16 ENCOUNTER — Encounter
Admission: RE | Disposition: A | Discharge status: Home / Self Care | Payer: Self-pay | Source: Home / Self Care | Attending: Gastroenterology

## 2021-02-16 ENCOUNTER — Encounter: Payer: Self-pay | Admitting: Gastroenterology

## 2021-02-16 ENCOUNTER — Ambulatory Visit: Payer: Managed Care, Other (non HMO) | Admitting: Certified Registered"

## 2021-02-16 DIAGNOSIS — K635 Polyp of colon: Secondary | ICD-10-CM | POA: Diagnosis not present

## 2021-02-16 DIAGNOSIS — Z7952 Long term (current) use of systemic steroids: Secondary | ICD-10-CM | POA: Insufficient documentation

## 2021-02-16 DIAGNOSIS — K219 Gastro-esophageal reflux disease without esophagitis: Secondary | ICD-10-CM | POA: Diagnosis not present

## 2021-02-16 DIAGNOSIS — Z1211 Encounter for screening for malignant neoplasm of colon: Secondary | ICD-10-CM | POA: Diagnosis not present

## 2021-02-16 DIAGNOSIS — I10 Essential (primary) hypertension: Secondary | ICD-10-CM | POA: Diagnosis not present

## 2021-02-16 DIAGNOSIS — D122 Benign neoplasm of ascending colon: Secondary | ICD-10-CM | POA: Insufficient documentation

## 2021-02-16 DIAGNOSIS — Z79899 Other long term (current) drug therapy: Secondary | ICD-10-CM | POA: Insufficient documentation

## 2021-02-16 DIAGNOSIS — Z79890 Hormone replacement therapy (postmenopausal): Secondary | ICD-10-CM | POA: Diagnosis not present

## 2021-02-16 DIAGNOSIS — M1990 Unspecified osteoarthritis, unspecified site: Secondary | ICD-10-CM | POA: Insufficient documentation

## 2021-02-16 DIAGNOSIS — K5730 Diverticulosis of large intestine without perforation or abscess without bleeding: Secondary | ICD-10-CM | POA: Diagnosis not present

## 2021-02-16 DIAGNOSIS — E039 Hypothyroidism, unspecified: Secondary | ICD-10-CM | POA: Insufficient documentation

## 2021-02-16 DIAGNOSIS — K640 First degree hemorrhoids: Secondary | ICD-10-CM | POA: Insufficient documentation

## 2021-02-16 HISTORY — PX: COLONOSCOPY: SHX5424

## 2021-02-16 SURGERY — COLONOSCOPY
Anesthesia: General

## 2021-02-16 MED ORDER — PROPOFOL 10 MG/ML IV BOLUS
INTRAVENOUS | Status: DC | PRN
  Administered 2021-02-16: 80 mg via INTRAVENOUS

## 2021-02-16 MED ORDER — SODIUM CHLORIDE 0.9 % IV SOLN
INTRAVENOUS | Status: DC
  Administered 2021-02-16: 20 mL/h via INTRAVENOUS

## 2021-02-16 MED ORDER — LIDOCAINE HCL (CARDIAC) PF 100 MG/5ML IV SOSY
PREFILLED_SYRINGE | INTRAVENOUS | Status: DC | PRN
  Administered 2021-02-16: 100 mg via INTRAVENOUS

## 2021-02-16 MED ORDER — PROPOFOL 500 MG/50ML IV EMUL
INTRAVENOUS | Status: DC | PRN
  Administered 2021-02-16: 150 ug/kg/min via INTRAVENOUS

## 2021-02-16 NOTE — Anesthesia Preprocedure Evaluation (Addendum)
Anesthesia Evaluation  Patient identified by MRN, date of birth, ID band Patient awake    Reviewed: Allergy & Precautions, NPO status , Patient's Chart, lab work & pertinent test results  Airway Mallampati: II  TM Distance: >3 FB Neck ROM: full    Dental no notable dental hx.    Pulmonary neg pulmonary ROS,    Pulmonary exam normal        Cardiovascular Exercise Tolerance: Good hypertension, Normal cardiovascular exam     Neuro/Psych negative neurological ROS  negative psych ROS   GI/Hepatic Neg liver ROS, GERD  Controlled and Medicated,  Endo/Other  Hypothyroidism   Renal/GU negative Renal ROS  negative genitourinary   Musculoskeletal  (+) Arthritis ,   Abdominal   Peds  Hematology negative hematology ROS (+)   Anesthesia Other Findings Past Medical History: No date: Allergic rhinitis No date: GERD (gastroesophageal reflux disease) No date: HTN (hypertension) 07/2009: Left inguinal hernia  Past Surgical History: No date: BACK SURGERY     Comment:  1988-- L4-L5 laminectomy 02/16/2021: COLONOSCOPY; N/A     Comment:  Procedure: COLONOSCOPY;  Surgeon: Jonathon Bellows, MD;                Location: [facility] END[facility]COPY;  Service: Gastroenterology;                Laterality: N/A; 2011: HERNIA REPAIR; Left  BMI    Body Mass Index: 34.02 kg/m      Reproductive/Obstetrics negative OB ROS                            Anesthesia Physical Anesthesia Plan  ASA: 2  Anesthesia Plan: General   Post-op Pain Management:    Induction:   PONV Risk Score and Plan: 2 and Propofol infusion, TIVA and Treatment may vary due to age or medical condition  Airway Management Planned:   Additional Equipment:   Intra-op Plan:   Post-operative Plan:   Informed Consent: I have reviewed the patients History and Physical, chart, labs and discussed the procedure including the risks, benefits and alternatives  for the proposed anesthesia with the patient or authorized representative who has indicated his/her understanding and acceptance.     Dental Advisory Given  Plan Discussed with: Anesthesiologist, CRNA and Surgeon  Anesthesia Plan Comments:         Anesthesia Quick Evaluation

## 2021-02-16 NOTE — Op Note (Signed)
[facility] Gastroenterology Patient Name: Nicholas Wall Procedure Date: 02/16/2021 9:14 AM MRN: [id number] Account #: [id number] Date of Birth: 09-27-1955 Admit Type: Outpatient Age: 66 Room: [facility] ENDO ROOM 4 Gender: Male Note Status: Finalized Instrument Name: Jasper Riling [id number] Procedure:             Colonoscopy Indications:           Screening for colorectal malignant neoplasm Providers:             Jonathon Bellows MD, MD Medicines:             Monitored Anesthesia Care Complications:         No immediate complications. Procedure:             Pre-Anesthesia Assessment:                        - Prior to the procedure, a History and Physical was                         performed, and patient medications, allergies and                         sensitivities were reviewed. The patient's tolerance                         of previous anesthesia was reviewed.                        - The risks and benefits of the procedure and the                         sedation options and risks were discussed with the                         patient. All questions were answered and informed                         consent was obtained.                        - ASA Grade Assessment: II - A patient with mild                         systemic disease.                        After obtaining informed consent, the colonoscope was                         passed under direct vision. Throughout the procedure,                         the patient's blood pressure, pulse, and oxygen                         saturations were monitored continuously. The                         Colonoscope was introduced through the anus and  advanced to the the cecum, identified by the                         appendiceal orifice. The colonoscopy was performed                         with ease. The patient tolerated the procedure well.                         The quality of the bowel  preparation was excellent. Findings:      The perianal and digital rectal examinations were normal.      Four sessile polyps were found in the ascending colon. The polyps were 4       to 6 mm in size. These polyps were removed with a cold snare. Resection       and retrieval were complete.      Multiple small-mouthed diverticula were found in the sigmoid colon.      Non-bleeding internal hemorrhoids were found during retroflexion. The       hemorrhoids were medium-sized and Grade I (internal hemorrhoids that do       not prolapse).      The exam was otherwise without abnormality on direct and retroflexion       views. Impression:            - Four 4 to 6 mm polyps in the ascending colon,                         removed with a cold snare. Resected and retrieved.                        - Diverticulosis in the sigmoid colon.                        - Non-bleeding internal hemorrhoids.                        - The examination was otherwise normal on direct and                         retroflexion views. Recommendation:        - Discharge patient to home (with escort).                        - Resume previous diet.                        - Continue present medications.                        - Await pathology results.                        - Repeat colonoscopy for surveillance based on                         pathology results. Procedure Code(s):     --- Professional ---                        [id number], Colonoscopy, flexible; with removal of  tumor(s), polyp(s), or other lesion(s) by snare                         technique Diagnosis Code(s):     --- Professional ---                        Z12.11, Encounter for screening for malignant neoplasm                         of colon                        K63.5, Polyp of colon                        K64.0, First degree hemorrhoids                        K57.30, Diverticulosis of large intestine without                          perforation or abscess without bleeding CPT copyright 2019 American Medical Association. All rights reserved. The codes documented in this report are preliminary and upon coder review may  be revised to meet current compliance requirements. Jonathon Bellows, MD Jonathon Bellows MD, MD 02/16/2021 9:49:58 AM This report has been signed electronically. Number of Addenda: 0 Note Initiated On: 02/16/2021 9:14 AM Scope Withdrawal Time: 0 hours 13 minutes 15 seconds  Total Procedure Duration: 0 hours 16 minutes 17 seconds  Estimated Blood Loss:  Estimated blood loss: none.      [facility]

## 2021-02-16 NOTE — Transfer of Care (Addendum)
Immediate Anesthesia Transfer of Care Note  Patient: Nicholas Wall  Procedure(s) Performed: COLONOSCOPY  Patient Location: PACU  Anesthesia Type:General  Level of Consciousness: awake  Airway & Oxygen Therapy: Patient Spontanous Breathing  Post-op Assessment: Report given to RN  Post vital signs: stable  Last Vitals:  Vitals Value Taken Time  BP    Temp    Pulse    Resp    SpO2      Last Pain:  Vitals:   02/16/21 0856  TempSrc: Temporal  PainSc: 6          Complications: No notable events documented.

## 2021-02-16 NOTE — Anesthesia Postprocedure Evaluation (Signed)
Anesthesia Post Note  Patient: Nicholas Wall  Procedure(s) Performed: COLONOSCOPY  Patient location during evaluation: PACU Anesthesia Type: General Level of consciousness: awake and alert Pain management: pain level controlled Vital Signs Assessment: post-procedure vital signs reviewed and stable Respiratory status: spontaneous breathing, nonlabored ventilation and respiratory function stable Cardiovascular status: blood pressure returned to baseline and stable Postop Assessment: no apparent nausea or vomiting Anesthetic complications: no   No notable events documented.   Last Vitals:  Vitals:   02/16/21 0958 02/16/21 1008  BP: 125/72 (!) 152/92  Pulse: 70 64  Resp: 16 16  Temp:    SpO2: 99% 100%    Last Pain:  Vitals:   02/16/21 1008  TempSrc:   PainSc: 0-No pain                 [place] Ouch

## 2021-02-16 NOTE — H&P (Signed)
° ° ° ° , MD °[address], [address], [place], [place], [address] °[address], [address], [place], , [address] °Phone: [phone]  °Fax: [phone] ° °Primary Care Physician:  Johnson, Megan P, DO ° ° °Pre-Procedure History & Physical: °HPI:  Nicholas Wall is a 66 y.o. male is here for an colonoscopy. °  °Past Medical History:  °Diagnosis Date  ° Allergic rhinitis   ° GERD (gastroesophageal reflux disease)   ° HTN (hypertension)   ° Left inguinal hernia 07/2009  ° ° °Past Surgical History:  °Procedure Laterality Date  ° BACK SURGERY    ° 1988-- L4-L5 laminectomy  ° HERNIA REPAIR Left 2011  ° ° °Prior to Admission medications   °Medication Sig Start Date End Date Taking? Authorizing Provider  °ibuprofen (ADVIL) 800 MG tablet TAKE 1 TABLET BY MOUTH EVERY 8 HOURS AS NEEDED 01/28/21  Yes Johnson, Megan P, DO  °levothyroxine (SYNTHROID) 88 MCG tablet Take 1 tablet (88 mcg total) by mouth daily. 11/26/20  Yes Johnson, Megan P, DO  °losartan (COZAAR) 100 MG tablet Take 1 tablet (100 mg total) by mouth daily. 11/24/20  Yes Johnson, Megan P, DO  °omeprazole (PRILOSEC) 20 MG capsule TAKE 1 CAPSULE BY MOUTH EVERY DAY 12/29/20  Yes Johnson, Megan P, DO  °Na Sulfate-K Sulfate-Mg Sulf (SUPREP BOWEL PREP KIT) 17.5-3.13-1.6 GM/177ML SOLN Take 1 kit by mouth as directed. °Patient not taking: Reported on 12/29/2020 12/15/20   , , MD  °predniSONE (DELTASONE) 50 MG tablet Take 1 tablet (50 mg total) by mouth daily with breakfast. °Patient not taking: Reported on 02/16/2021 12/29/20   Johnson, Megan P, DO  ° ° °Allergies as of 12/13/2020  ° (No Known Allergies)  ° ° °Family History  °Problem Relation Age of Onset  ° Hypertension Mother   ° Hyperlipidemia Mother   ° Diabetes Mother   ° Arthritis Mother   ° Vision loss Mother   ° Thyroid disease Mother   ° Cancer Father   ° Hyperlipidemia Father   ° Arthritis Father   ° Heart disease Father   ° Thyroid disease Brother   ° Hyperlipidemia Maternal Grandmother   °  Hypertension Maternal Grandmother   ° Alzheimer's disease Maternal Grandmother   ° Heart disease Maternal Grandfather   ° Hyperlipidemia Maternal Grandfather   ° Hypertension Maternal Grandfather   ° Cancer Paternal Grandmother   ° Thyroid disease Brother   ° ° °Social History  ° °Socioeconomic History  ° Marital status: Married  °  Spouse name: Not on file  ° Number of children: Not on file  ° Years of education: Not on file  ° Highest education level: Not on file  °Occupational History  ° Not on file  °Tobacco Use  ° Smoking status: Never  ° Smokeless tobacco: Never  °Vaping Use  ° Vaping Use: Never used  °Substance and Sexual Activity  ° Alcohol use: Yes  °  Comment: daily  ° Drug use: No  ° Sexual activity: Not Currently  °Other Topics Concern  ° Not on file  °Social History Narrative  ° Not on file  ° °Social Determinants of Health  ° °Financial Resource Strain: Not on file  °Food Insecurity: Not on file  °Transportation Needs: Not on file  °Physical Activity: Not on file  °Stress: Not on file  °Social Connections: Not on file  °Intimate Partner Violence: Not on file  ° ° °Review of Systems: °See HPI, otherwise negative ROS ° °Physical Exam: °BP (!)   176/95    Pulse (!) 2    Temp (!) 96.7 F (35.9 C) (Temporal)    Resp 20    Ht 6' 2" (1.88 m)    Wt 120.2 kg    SpO2 99%    BMI 34.02 kg/m  General:   Alert,  pleasant and cooperative in NAD Head:  Normocephalic and atraumatic. Neck:  Supple; no masses or thyromegaly. Lungs:  Clear throughout to auscultation, normal respiratory effort.    Heart:  +S1, +S2, Regular rate and rhythm, No edema. Abdomen:  Soft, nontender and nondistended. Normal bowel sounds, without guarding, and without rebound.   Neurologic:  Alert and  oriented x4;  grossly normal neurologically.  Impression/Plan: [name] is here for an colonoscopy to be performed for Screening colonoscopy average risk   Risks, benefits, limitations, and alternatives regarding  colonoscopy have  been reviewed with the patient.  Questions have been answered.  All parties agreeable.   Jonathon Bellows, MD  02/16/2021, 9:16 AM

## 2021-02-18 NOTE — Progress Notes (Signed)
Non-identified Voicemail.  No Message Left. 

## 2021-02-19 ENCOUNTER — Encounter: Payer: Self-pay | Admitting: Gastroenterology

## 2021-02-19 LAB — SURGICAL PATHOLOGY

## 2021-02-26 ENCOUNTER — Encounter: Payer: Self-pay | Admitting: Family Medicine

## 2021-03-01 ENCOUNTER — Other Ambulatory Visit: Payer: Self-pay | Admitting: Family Medicine

## 2021-03-01 DIAGNOSIS — R899 Unspecified abnormal finding in specimens from other organs, systems and tissues: Secondary | ICD-10-CM

## 2021-03-01 DIAGNOSIS — M1990 Unspecified osteoarthritis, unspecified site: Secondary | ICD-10-CM

## 2021-03-09 ENCOUNTER — Other Ambulatory Visit: Payer: Self-pay | Admitting: Family Medicine

## 2021-03-09 ENCOUNTER — Ambulatory Visit: Payer: Managed Care, Other (non HMO) | Admitting: Family Medicine

## 2021-03-09 NOTE — Telephone Encounter (Signed)
Requested Prescriptions  Pending Prescriptions Disp Refills   levothyroxine (SYNTHROID) 88 MCG tablet [Pharmacy Med Name: LEVOTHYROXINE 88 MCG TABLET] 90 tablet 2    Sig: TAKE 1 TABLET BY MOUTH EVERY DAY     Endocrinology:  Hypothyroid Agents Passed - 03/09/2021 10:13 AM      Passed - TSH in normal range and within 360 days    TSH  Date Value Ref Range Status  12/29/2020 2.910 0.450 - 4.500 uIU/mL Final         Passed - Valid encounter within last 12 months    Recent Outpatient Visits          2 months ago Essential hypertension   [place], Megan P, DO   3 months ago Routine general medical examination at a health care facility   [facility], [place] P, DO   9 months ago Vitamin D deficiency   [place], [place], DO   1 year ago Essential hypertension   [place], [place], DO   1 year ago Essential hypertension   [place], [place], DO      Future Appointments            In 2 months Wynetta Emery, Barb Merino, DO [employer]

## 2021-03-23 ENCOUNTER — Ambulatory Visit: Payer: Self-pay

## 2021-03-23 NOTE — Telephone Encounter (Signed)
Pt called with RA pain in his joints especially when he gets up in the morning.  He said the pain was really bad at times.  Dr. Wynetta Emery referred him to Dr. Benjamine Mola but he wont see him until April.  Pt is wanting to know if something can be prescribed for the pain until he gets into the specialist in April.  ? ?[facility] [address] is his pharmacy.  ? ?CB#  [phone]   ?  ?       Left message to call back about symptoms. ?

## 2021-03-23 NOTE — Telephone Encounter (Signed)
Reason for Disposition ?? [1] MILD or MODERATE muscle aches or pain AND [2] taking a statin medicine (a lipid or cholesterol lowering drug) ? ?Answer Assessment - Initial Assessment Questions ?1. ONSET: "When did the muscle aches or body pains start?"  ?    Getting worse recently, last few months ?2. LOCATION: "What part of your body is hurting?" (e.g., entire body, arms, legs)  ?    All joints ?3. SEVERITY: "How bad is the pain?" (Scale 1-10; or mild, moderate, severe) ?  - MILD (1-3): doesn't interfere with normal activities  ?  - MODERATE (4-7): interferes with normal activities or awakens from sleep  ?  - SEVERE (8-10):  excruciating pain, unable to do any normal activities  ?    9/10 especially hands and shoulders ?4. CAUSE: "What do you think is causing the pains?" ?    unknown ?5. FEVER: "Have you been having fever?" ?    No fever ?6. OTHER SYMPTOMS: "Do you have any other symptoms?" (e.g., chest pain, weakness, rash, cold or flu symptoms, weight loss) ?    no ?7. PREGNANCY: "Is there any chance you are pregnant?" "When was your last menstrual period?" ?    na ?8. TRAVEL: "Have you traveled out of the country in the last month?" (e.g., travel history, exposures) ?    na ? ?Protocols used: Muscle Aches and Body Pain-A-AH ? ?

## 2021-03-26 ENCOUNTER — Ambulatory Visit: Payer: Managed Care, Other (non HMO) | Admitting: Internal Medicine

## 2021-03-26 ENCOUNTER — Other Ambulatory Visit: Payer: Self-pay

## 2021-03-26 ENCOUNTER — Encounter: Payer: Self-pay | Admitting: Internal Medicine

## 2021-03-26 VITALS — BP 134/82 | HR 78 | Temp 97.5°F | Ht 74.02 in | Wt 272.4 lb

## 2021-03-26 DIAGNOSIS — M2560 Stiffness of unspecified joint, not elsewhere classified: Secondary | ICD-10-CM

## 2021-03-26 MED ORDER — MELOXICAM 7.5 MG PO TABS: 7.5000 mg | ORAL_TABLET | Freq: Every day | ORAL | 0 refills | Status: DC

## 2021-03-26 MED ORDER — METHYLPREDNISOLONE 4 MG PO TBPK: ORAL_TABLET | ORAL | 0 refills | Status: DC

## 2021-03-26 MED ORDER — METHYLPREDNISOLONE SODIUM SUCC 40 MG IJ SOLR
120.0000 mg | Freq: Once | INTRAMUSCULAR | Status: AC
  Administered 2021-03-26: 120 mg via INTRAMUSCULAR

## 2021-03-26 NOTE — Progress Notes (Signed)
? ?BP 134/82   Pulse 78   Temp (!) 97.5 ?F (36.4 ?C) (Oral)   Ht 6' 2.02" (1.88 m)   Wt 272 lb 6.4 oz (123.6 kg)   SpO2 99%   BMI 34.96 kg/m?   ? ?Subjective:  ? ? Patient ID: Nicholas Wall, male    DOB: Aug 28, 1955, 66 y.o.   MRN: [id number] ? ?Chief Complaint  ?Patient presents with  ?? Arthritis  ?  Has RA appt in 4/14 as a new PT.  Wants something better to manage his pain  ? ? ?HPI: ?Nicholas Wall is a 66 y.o. male ? ?Arthritis ?Presents for initial (he is supposed to see rheumatology for such dr rice @     has pain in bil hands wrists shoulders elbows neck hips knees) visit. The disease course has been fluctuating. He complains of stiffness. He reports no pain, joint swelling or joint warmth. (cannot close his hands)  ? ?Chief Complaint  ?Patient presents with  ?? Arthritis  ?  Has RA appt in 4/14 as a new PT.  Wants something better to manage his pain  ? ? ?Relevant past medical, surgical, family and social history reviewed and updated as indicated. Interim medical history since our last visit reviewed. ?Allergies and medications reviewed and updated. ? ?Review of Systems  ?Musculoskeletal:  Positive for arthritis and stiffness. Negative for joint swelling.  ? ?Per HPI unless specifically indicated above ? ?   ?Objective:  ?  ?BP 134/82   Pulse 78   Temp (!) 97.5 ?F (36.4 ?C) (Oral)   Ht 6' 2.02" (1.88 m)   Wt 272 lb 6.4 oz (123.6 kg)   SpO2 99%   BMI 34.96 kg/m?   ?Wt Readings from Last 3 Encounters:  ?03/26/21 272 lb 6.4 oz (123.6 kg)  ?02/16/21 265 lb (120.2 kg)  ?12/29/20 270 lb 6.4 oz (122.7 kg)  ?  ?Physical Exam ?Vitals and nursing note reviewed.  ?Constitutional:   ?   General: He is not in acute distress. ?   Appearance: Normal appearance. He is not ill-appearing or diaphoretic.  ?HENT:  ?   Head: Normocephalic and atraumatic.  ?   Right Ear: Tympanic membrane and external ear normal. There is no impacted cerumen.  ?   Left Ear: External ear normal.  ?   Nose: No congestion or rhinorrhea.   ?   Mouth/Throat:  ?   Pharynx: No oropharyngeal exudate or posterior oropharyngeal erythema.  ?Eyes:  ?   Conjunctiva/sclera: Conjunctivae normal.  ?   Pupils: Pupils are equal, round, and reactive to light.  ?Cardiovascular:  ?   Rate and Rhythm: Normal rate and regular rhythm.  ?   Heart sounds: No murmur heard. ?  No friction rub. No gallop.  ?Pulmonary:  ?   Effort: No respiratory distress.  ?   Breath sounds: No stridor. No wheezing or rhonchi.  ?Chest:  ?   Chest wall: No tenderness.  ?Abdominal:  ?   General: Abdomen is flat. Bowel sounds are normal.  ?   Palpations: Abdomen is soft. There is no mass.  ?   Tenderness: There is no abdominal tenderness.  ?Musculoskeletal:     ?   General: No swelling, tenderness or signs of injury.  ?   Cervical back: Normal range of motion and neck supple. No rigidity or tenderness.  ?   Right lower leg: No edema.  ?   Left lower leg: No edema.  ?   Comments: Decreased  ROM of the left shoudler > right   ?Skin: ?   General: Skin is warm and dry.  ?Neurological:  ?   Mental Status: He is alert.  ? ?Results for orders placed or performed during the hospital encounter of 02/16/21  ?Surgical pathology  ?Result Value Ref Range  ? SURGICAL PATHOLOGY    ?  SURGICAL PATHOLOGY ?CASE: [id number] ?PATIENT: Erenest Rasher ?Surgical Pathology Report ? ? ? ? ?Specimen Submitted: ?A. Colon polyp x4, ascending; cold snare ? ?Clinical History: Screening colonoscopy.  Polyps; diverticulosis; ?hemorrhoids ? ? ? ? ? ?DIAGNOSIS: ?A. COLON POLYP X4, ASCENDING; COLD SNARE: ?- TUBULAR ADENOMA, MULTIPLE FRAGMENTS. ?- NEGATIVE FOR HIGH GRADE DYSPLASIA AND MALIGNANCY. ? ? ?GROSS DESCRIPTION: ?A. Labeled: Cold snare ascending colon polyp x4 ?Received: Formalin ?Collection time: 9:33 AM on 02/16/2021 ?Placed into formalin time: 9:33 AM on 02/16/2021 ?Tissue fragment(s): Multiple ?Size: Aggregate, 2.0 x 0.3 x 0.1 cm ?Description: Tan soft tissue fragments ?Entirely submitted in 1 cassette. ? ?CM  02/16/2021 ? ?Final Diagnosis performed by Betsy Pries, MD.   Electronically signed ?02/19/2021 8:31:36AM ?The electronic signature indicates that the named Attending Pathologist ?has evaluated the specimen ?Technical component performed at [facility], [address],  [place], ?[place] [address] Lab: [phone] Dir: Rush Farmer, MD, MMM ? Professional component performed at [facility], [facility], [place], [place], [place] [address] Lab: [phone] ?Dir: Kathi Simpers, MD ?  ? ?   ? ? ?Current Outpatient Medications:  ??  levothyroxine (SYNTHROID) 88 MCG tablet, TAKE 1 TABLET BY MOUTH EVERY DAY, Disp: 90 tablet, Rfl: 2 ??  losartan (COZAAR) 100 MG tablet, Take 1 tablet (100 mg total) by mouth daily., Disp: 90 tablet, Rfl: 1 ??  meloxicam (MOBIC) 7.5 MG tablet, Take 1 tablet (7.5 mg total) by mouth daily., Disp: 30 tablet, Rfl: 0 ??  methylPREDNISolone (MEDROL DOSEPAK) 4 MG TBPK tablet, As directed pl, Disp: 1 each, Rfl: 0 ??  omeprazole (PRILOSEC) 20 MG capsule, TAKE 1 CAPSULE BY MOUTH EVERY DAY, Disp: 90 capsule, Rfl: 3  ? ? ?Assessment & Plan:  ?Swelling/ pain in bil upper and lower ext as on HPI ?Will need to start pt on steroid taper ?Change nsaids to Meloxicam ?Fu with pcp if doesn't get bettter. ?Check xrays of bil shoulders pain worse in Left shoudler unable to raise this above a 90 degrees angle. ? ?Problem List Items Addressed This Visit   ?None ?Visit Diagnoses   ? ? Stiffness in joint    -  Primary  ? Relevant Medications  ? methylPREDNISolone sodium succinate (SOLU-MEDROL) 40 mg/mL injection 120 mg (Completed)  ? Other Relevant Orders  ? DG Shoulder Left  ? DG Shoulder Right  ? ?  ?  ? ?Orders Placed This Encounter  ?Procedures  ?? DG Shoulder Left  ?? DG Shoulder Right  ?  ? ?Meds ordered this encounter  ?Medications  ?? methylPREDNISolone (MEDROL DOSEPAK) 4 MG TBPK tablet  ?  Sig: As directed pl  ?  Dispense:  1 each  ?  Refill:  0  ?? meloxicam (MOBIC) 7.5 MG tablet  ?   Sig: Take 1 tablet (7.5 mg total) by mouth daily.  ?  Dispense:  30 tablet  ?  Refill:  0  ?? methylPREDNISolone sodium succinate (SOLU-MEDROL) 40 mg/mL injection 120 mg  ?  ? ?Follow up plan: ?No follow-ups on file. ? ? ?

## 2021-03-28 ENCOUNTER — Ambulatory Visit
Admission: RE | Admit: 2021-03-28 | Discharge: 2021-03-28 | Disposition: A | Discharge status: Home / Self Care | Payer: Managed Care, Other (non HMO) | Attending: Internal Medicine | Admitting: Internal Medicine

## 2021-03-28 ENCOUNTER — Ambulatory Visit
Admission: RE | Admit: 2021-03-28 | Discharge: 2021-03-28 | Disposition: A | Discharge status: Home / Self Care | Payer: Managed Care, Other (non HMO) | Source: Ambulatory Visit | Attending: Internal Medicine | Admitting: Internal Medicine

## 2021-03-28 DIAGNOSIS — M2560 Stiffness of unspecified joint, not elsewhere classified: Secondary | ICD-10-CM | POA: Diagnosis not present

## 2021-03-29 NOTE — Progress Notes (Signed)
Pl let pt know the findins below  , if continues hurting might need ortho referral, needs to fu with pcp thnx. Xray of shoulder shows :  ?No acute osseous abnormality. Minimal glenohumeral and moderate shoulder  ?joint degenerative change. ??

## 2021-04-12 ENCOUNTER — Ambulatory Visit: Payer: Managed Care, Other (non HMO) | Admitting: Family Medicine

## 2021-04-12 ENCOUNTER — Other Ambulatory Visit: Payer: Self-pay

## 2021-04-12 ENCOUNTER — Encounter: Payer: Self-pay | Admitting: Family Medicine

## 2021-04-12 VITALS — BP 112/78 | HR 82 | Temp 98.4°F | Wt 275.6 lb

## 2021-04-12 DIAGNOSIS — M1990 Unspecified osteoarthritis, unspecified site: Secondary | ICD-10-CM | POA: Diagnosis not present

## 2021-04-12 MED ORDER — MELOXICAM 15 MG PO TABS: 15.0000 mg | ORAL_TABLET | Freq: Every day | ORAL | 1 refills | Status: DC

## 2021-04-12 NOTE — Progress Notes (Signed)
? ?BP 112/78   Pulse 82   Temp 98.4 ?F (36.9 ?C)   Wt 275 lb 9.6 oz (125 kg)   SpO2 97%   BMI 35.37 kg/m?   ? ?Subjective:  ? ? Patient ID: Nicholas Wall, male    DOB: 1955-09-01, 66 y.o.   MRN: [id number] ? ?HPI: ?Nicholas Wall is a 65 y.o. male ? ?Chief Complaint  ?Patient presents with  ? Joint Pain  ?  Patient states he is here to follow up from previous visit. Pain is a little better but is still in his hands, feet and shoulders. Patient is taking meloxicam daily.   ? ?ARTHRALGIAS / JOINT ACHES ?Duration: months ?Pain: yes ?Symmetric: yes ?Severity: severe ?Quality: sharp ?Frequency: constant ?Context:  better ?Decreased function/range of motion: yes  ?Erythema: no ?Swelling: yes ?Heat or warmth: no ?Morning stiffness: yes ?Relief with NSAIDs?: moderate ?Treatments attempted:   steroids, rest, ice, heat, APAP, ibuprofen, and aleve  ?Involved Joints:  ?   Hands: yes bilateral ?   Wrists: yes bilateral  ?   Elbows: yes bilateral ?   Shoulders: yes bilateral ?   Back: yes  ?   Hips: yes bilateral ?   Knees: yes bilateral ?   Ankles: yes bilateral ?   Feet: yes bilateral ? ? ?Relevant past medical, surgical, family and social history reviewed and updated as indicated. Interim medical history since our last visit reviewed. ?Allergies and medications reviewed and updated. ? ?Review of Systems  ?Constitutional: Negative.   ?Respiratory: Negative.    ?Cardiovascular: Negative.   ?Musculoskeletal:  Positive for arthralgias, back pain, joint swelling and myalgias. Negative for gait problem, neck pain and neck stiffness.  ?Skin: Negative.   ?Neurological: Negative.   ?Psychiatric/Behavioral: Negative.    ? ?Per HPI unless specifically indicated above ? ?   ?Objective:  ?  ?BP 112/78   Pulse 82   Temp 98.4 ?F (36.9 ?C)   Wt 275 lb 9.6 oz (125 kg)   SpO2 97%   BMI 35.37 kg/m?   ?Wt Readings from Last 3 Encounters:  ?04/12/21 275 lb 9.6 oz (125 kg)  ?03/26/21 272 lb 6.4 oz (123.6 kg)  ?02/16/21 265 lb (120.2 kg)   ?  ?Physical Exam ?Vitals and nursing note reviewed.  ?Constitutional:   ?   General: He is not in acute distress. ?   Appearance: Normal appearance. He is not ill-appearing, toxic-appearing or diaphoretic.  ?HENT:  ?   Head: Normocephalic and atraumatic.  ?   Right Ear: External ear normal.  ?   Left Ear: External ear normal.  ?   Nose: Nose normal.  ?   Mouth/Throat:  ?   Mouth: Mucous membranes are moist.  ?   Pharynx: Oropharynx is clear.  ?Eyes:  ?   General: No scleral icterus.    ?   Right eye: No discharge.     ?   Left eye: No discharge.  ?   Extraocular Movements: Extraocular movements intact.  ?   Conjunctiva/sclera: Conjunctivae normal.  ?   Pupils: Pupils are equal, round, and reactive to light.  ?Cardiovascular:  ?   Rate and Rhythm: Normal rate and regular rhythm.  ?   Pulses: Normal pulses.  ?   Heart sounds: Normal heart sounds. No murmur heard. ?  No friction rub. No gallop.  ?Pulmonary:  ?   Effort: Pulmonary effort is normal. No respiratory distress.  ?   Breath sounds: Normal breath sounds. No stridor. No  wheezing, rhonchi or rales.  ?Chest:  ?   Chest wall: No tenderness.  ?Musculoskeletal:     ?   General: Normal range of motion.  ?   Cervical back: Normal range of motion and neck supple.  ?Skin: ?   General: Skin is warm and dry.  ?   Capillary Refill: Capillary refill takes less than 2 seconds.  ?   Coloration: Skin is not jaundiced or pale.  ?   Findings: No bruising, erythema, lesion or rash.  ?Neurological:  ?   General: No focal deficit present.  ?   Mental Status: He is alert and oriented to person, place, and time. Mental status is at baseline.  ?Psychiatric:     ?   Mood and Affect: Mood normal.     ?   Behavior: Behavior normal.     ?   Thought Content: Thought content normal.     ?   Judgment: Judgment normal.  ? ? ?Results for orders placed or performed during the hospital encounter of 02/16/21  ?Surgical pathology  ?Result Value Ref Range  ? SURGICAL PATHOLOGY    ?  SURGICAL  PATHOLOGY ?CASE: [id number] ?PATIENT: Nicholas Wall ?Surgical Pathology Report ? ? ? ? ?Specimen Submitted: ?A. Colon polyp x4, ascending; cold snare ? ?Clinical History: Screening colonoscopy.  Polyps; diverticulosis; ?hemorrhoids ? ? ? ? ? ?DIAGNOSIS: ?A. COLON POLYP X4, ASCENDING; COLD SNARE: ?- TUBULAR ADENOMA, MULTIPLE FRAGMENTS. ?- NEGATIVE FOR HIGH GRADE DYSPLASIA AND MALIGNANCY. ? ? ?GROSS DESCRIPTION: ?A. Labeled: Cold snare ascending colon polyp x4 ?Received: Formalin ?Collection time: 9:33 AM on 02/16/2021 ?Placed into formalin time: 9:33 AM on 02/16/2021 ?Tissue fragment(s): Multiple ?Size: Aggregate, 2.0 x 0.3 x 0.1 cm ?Description: Tan soft tissue fragments ?Entirely submitted in 1 cassette. ? ?CM 02/16/2021 ? ?Final Diagnosis performed by Betsy Pries, MD.   Electronically signed ?02/19/2021 8:31:36AM ?The electronic signature indicates that the named Attending Pathologist ?has evaluated the specimen ?Technical component performed at [facility], [address],  [place], ?[place] [address] Lab: [phone] Dir: Rush Farmer, MD, MMM ? Professional component performed at [facility], [facility], [place], [place], [place] [address] Lab: [phone] ?Dir: Kathi Simpers, MD ?  ? ?   ?Assessment & Plan:  ? ?Problem List Items Addressed This Visit   ?None ?Visit Diagnoses   ? ? Arthritis    -  Primary  ? Seeing rheumatology in 3 weeks. Will increase his meloxicam to '15mg'$  daily. Continue to monitor. Call with any concerns.   ? Relevant Medications  ? meloxicam (MOBIC) 15 MG tablet  ? ?  ?  ? ?Follow up plan: ?Return if symptoms worsen or fail to improve. ? ? ? ? ? ?

## 2021-04-13 ENCOUNTER — Other Ambulatory Visit: Payer: Self-pay | Admitting: Family Medicine

## 2021-04-16 NOTE — Telephone Encounter (Signed)
Rx 11/24/20 #90 1RF (6 month sppply) ?Requested Prescriptions  ?Pending Prescriptions Disp Refills  ?? losartan (COZAAR) 100 MG tablet [Pharmacy Med Name: LOSARTAN POTASSIUM 100 MG TAB] 90 tablet 1  ?  Sig: TAKE 1 TABLET BY MOUTH EVERY DAY  ?  ? Cardiovascular:  Angiotensin Receptor Blockers Passed - 04/13/2021  9:15 PM  ?  ?  Passed - Cr in normal range and within 180 days  ?  Creatinine, Ser  ?Date Value Ref Range Status  ?12/29/2020 0.83 0.76 - 1.27 mg/dL Final  ?   ?  ?  Passed - K in normal range and within 180 days  ?  Potassium  ?Date Value Ref Range Status  ?12/29/2020 4.6 3.5 - 5.2 mmol/L Final  ?   ?  ?  Passed - Patient is not pregnant  ?  ?  Passed - Last BP in normal range  ?  BP Readings from Last 1 Encounters:  ?04/12/21 112/78  ?   ?  ?  Passed - Valid encounter within last 6 months  ?  Recent Outpatient Visits   ?      ? 4 days ago Arthritis  ? [place], [place] P, DO  ? 3 weeks ago Stiffness in joint  ? [facility] Vigg, Avant[place], MD  ? 3 months ago Essential hypertension  ? [place], DO  ? 4 months ago Routine general medical examination at a health care facility  ? [place], Megan P, DO  ? 11 months ago Vitamin D deficiency  ? [place], [place] P, DO  ?  ?  ?Future Appointments   ?        ? In 1 month Johnson, Barb Merino, DO [facility]  ?  ? ?  ?  ?  ? ?

## 2021-04-20 ENCOUNTER — Ambulatory Visit: Payer: Managed Care, Other (non HMO) | Admitting: Family Medicine

## 2021-04-22 ENCOUNTER — Other Ambulatory Visit: Payer: Self-pay | Admitting: Internal Medicine

## 2021-04-24 NOTE — Telephone Encounter (Signed)
Duplicate request. ?Requested Prescriptions  ?Pending Prescriptions Disp Refills  ?? meloxicam (MOBIC) 7.5 MG tablet [Pharmacy Med Name: MELOXICAM 7.5 MG TABLET] 30 tablet 0  ?  Sig: TAKE 1 TABLET BY MOUTH EVERY DAY  ?  ? Analgesics:  COX2 Inhibitors Failed - 04/22/2021  9:04 AM  ?  ?  Failed - Manual Review: Labs are only required if the patient has taken medication for more than 8 weeks.  ?  ?  Failed - AST in normal range and within 360 days  ?  AST  ?Date Value Ref Range Status  ?11/24/2020 48 (H) 0 - 40 IU/L Final  ?   ?  ?  Failed - ALT in normal range and within 360 days  ?  ALT  ?Date Value Ref Range Status  ?11/24/2020 66 (H) 0 - 44 IU/L Final  ?   ?  ?  Passed - HGB in normal range and within 360 days  ?  Hemoglobin  ?Date Value Ref Range Status  ?11/24/2020 15.6 13.0 - 17.7 g/dL Final  ?   ?  ?  Passed - Cr in normal range and within 360 days  ?  Creatinine, Ser  ?Date Value Ref Range Status  ?12/29/2020 0.83 0.76 - 1.27 mg/dL Final  ?   ?  ?  Passed - HCT in normal range and within 360 days  ?  Hematocrit  ?Date Value Ref Range Status  ?11/24/2020 43.7 37.5 - 51.0 % Final  ?   ?  ?  Passed - eGFR is 30 or above and within 360 days  ?  GFR calc Af Amer  ?Date Value Ref Range Status  ?11/26/2019 112 >59 mL/min/1.73 Final  ?  Comment:  ?  **In accordance with recommendations from the NKF-ASN Task force,** ?  [facility] is in the process of updating its eGFR calculation to the ?  2021 CKD-EPI creatinine equation that estimates kidney function ?  without a race variable. ?  ? ?GFR calc non Af Amer  ?Date Value Ref Range Status  ?11/26/2019 97 >59 mL/min/1.73 Final  ? ?eGFR  ?Date Value Ref Range Status  ?12/29/2020 98 >59 mL/min/1.73 Final  ?   ?  ?  Passed - Patient is not pregnant  ?  ?  Passed - Valid encounter within last 12 months  ?  Recent Outpatient Visits   ?      ? 1 week ago Arthritis  ? [place], [place] P, DO  ? 4 weeks ago Stiffness in joint  ? [facility] Vigg,  Avanti, MD  ? 3 months ago Essential hypertension  ? [place], DO  ? 5 months ago Routine general medical examination at a health care facility  ? [place], Megan P, DO  ? 11 months ago Vitamin D deficiency  ? [place], [place] P, DO  ?  ?  ?Future Appointments   ?        ? In 1 month Johnson, Barb Merino, DO [facility]  ?  ? ?  ?  ?  ? ? ?

## 2021-05-04 ENCOUNTER — Encounter: Payer: Self-pay | Admitting: Internal Medicine

## 2021-05-04 ENCOUNTER — Ambulatory Visit: Payer: Managed Care, Other (non HMO) | Admitting: Internal Medicine

## 2021-05-04 VITALS — BP 147/95 | HR 76 | Resp 12 | Ht 73.0 in | Wt 270.2 lb

## 2021-05-04 DIAGNOSIS — G5603 Carpal tunnel syndrome, bilateral upper limbs: Secondary | ICD-10-CM | POA: Diagnosis not present

## 2021-05-04 DIAGNOSIS — M353 Polymyalgia rheumatica: Secondary | ICD-10-CM | POA: Diagnosis not present

## 2021-05-04 MED ORDER — PREDNISONE 10 MG PO TABS
ORAL_TABLET | ORAL | 0 refills | Status: DC
Start: 2021-05-04 — End: 2021-06-01

## 2021-05-04 NOTE — Progress Notes (Signed)
Office Visit Note  Patient: Nicholas Wall             Date of Birth: 06/25/1955           MRN: [id number]             PCP: Valerie Roys, DO Referring: Valerie Roys, DO Visit Date: 05/04/2021   Subjective:  Pain of the Left Foot (Patient states the first onset was between 6-12 months ago. ), Pain of the Right Foot, Pain of the Right Hip, Pain of the Left Hip, Pain of the Right Hand, Pain of the Left Hand, Pain of the Right Knee, and Pain of the Left Knee   History of Present Illness: Shimshon Narula is a 66 y.o. male here for evaluation of multiple joint pains with mildly positive RNP antibody positive mild OA on xray imaging. He has had some chronic pain in bilateral hands and feet for years, and with numbness in his hands affecting the first 3 fingers mostly. Pain in the bottoms and posterior of feet most often but these had been pretty constant. However since sometime over 6 months ago started to suffer with increased joint pain and stiffness. Started mostly in bilateral hips and then involving knees and low back and then also in both shoulders. This did not improve much with oral NSAIDs he went to PCP office with initial short prednisone treatment improved his symptoms a lot but returned within a month. He saw Dr. Neomia Dear due to symptoms returning and was treated with IM solumedrol injection and steroid taper again with good response but has gradually started to worsen again, longer after the treatment than the first time. He cannot raise his arms overhead without help. Also feels he is getting weak in lower body unable to bend over easily and cannot move well. Hand numbness and stiffness is also worsening again with the proximal joint symptoms. He drops items unexpectedly sometimes. There is only slight difference between morning and daytime symptoms.  12/2020 ANA neg RNP 1.0 Vit D 23.1  03/28/21 Right shoulder xray No acute osseous abnormality. Minimal glenohumeral and moderate AC  joint degenerative change. Left shoulder xray No acute osseous abnormality. Minimal glenohumeral and AC joint degenerative change.  05/19/20 Xray bilateral feet 1. No fracture or dislocation of the bilateral feet. 2. Minimal arthrosis of the bilateral first metatarsophalangeal joints and mid feet. 3. Enthesopathic change about the calcaneus and within the plantar fascia and distal Achilles tendons bilaterally.  Activities of Daily Living:  Patient reports morning stiffness for 24 hours.   Patient Reports nocturnal pain.  Difficulty dressing/grooming: Reports Difficulty climbing stairs: Denies Difficulty getting out of chair: Reports Difficulty using hands for taps, buttons, cutlery, and/or writing: Reports  Review of Systems  Constitutional:  Positive for fatigue.  HENT:  Negative for mouth dryness.   Eyes:  Negative for dryness.  Respiratory:  Negative for shortness of breath.   Cardiovascular:  Positive for swelling in legs/feet.  Gastrointestinal:  Negative for constipation.  Endocrine: Positive for increased urination.  Genitourinary:  Negative for difficulty urinating.  Musculoskeletal:  Positive for joint pain, joint pain, joint swelling, muscle weakness, morning stiffness and muscle tenderness.  Skin:  Negative for rash.  Allergic/Immunologic: Negative for susceptible to infections.  Neurological:  Positive for numbness and weakness.  Hematological:  Negative for bruising/bleeding tendency.  Psychiatric/Behavioral:  Positive for sleep disturbance.     PMFS History:  Patient Active Problem List   Diagnosis Date Noted   High  risk medication use 06/01/2021   Polymyalgia rheumatica ([place]) 05/04/2021   Essential hypertension 04/09/2019   Bilateral carpal tunnel syndrome 03/27/2018   Trigger middle finger of left hand 03/27/2018   Neuropathy 12/26/2017   Elevated CK 12/01/2017   Vitamin D deficiency 12/01/2017   Esophageal dysphagia 11/29/2017   Hypothyroidism 03/08/2016    GERD (gastroesophageal reflux disease) 03/08/2016   DJD (degenerative joint disease), lumbar 03/08/2016    Past Medical History:  Diagnosis Date   Allergic rhinitis    GERD (gastroesophageal reflux disease)    HTN (hypertension)    Hypothyroidism    Left inguinal hernia 07/2009   Polymyalgia rheumatica ([place])     Family History  Problem Relation Age of Onset   Hypertension Mother    Hyperlipidemia Mother    Diabetes Mother    Arthritis Mother    Vision loss Mother    Thyroid disease Mother    Cancer Father    Hyperlipidemia Father    Arthritis Father    Heart disease Father    Thyroid disease Brother    Hyperlipidemia Maternal Grandmother    Hypertension Maternal Grandmother    Alzheimer's disease Maternal Grandmother    Heart disease Maternal Grandfather    Hyperlipidemia Maternal Grandfather    Hypertension Maternal Grandfather    Cancer Paternal Grandmother    Thyroid disease Brother    Past Surgical History:  Procedure Laterality Date   BACK SURGERY     1988-- L4-L5 laminectomy   COLONOSCOPY N/A 02/16/2021   Procedure: COLONOSCOPY;  Surgeon: Jonathon Bellows, MD;  Location: [facility] ENDOSCOPY;  Service: Gastroenterology;  Laterality: N/A;   HERNIA REPAIR Left 2011   Social History   Social History Narrative   Not on file   Immunization History  Administered Date(s) Administered   Tdap 03/08/2016     Objective: Vital Signs: BP (!) 147/95 (BP Location: Right Arm, Patient Position: Sitting, Cuff Size: Large)   Pulse 76   Resp 12   Ht '6\' 1"'$  (1.854 m)   Wt 270 lb 3.2 oz (122.6 kg)   BMI 35.65 kg/m    Physical Exam Constitutional:      Appearance: He is obese.  Eyes:     Conjunctiva/sclera: Conjunctivae normal.  Cardiovascular:     Rate and Rhythm: Normal rate and regular rhythm.  Pulmonary:     Effort: Pulmonary effort is normal.     Breath sounds: Normal breath sounds.  Musculoskeletal:     Right lower leg: No edema.     Left lower leg: No edema.   Skin:    General: Skin is warm and dry.     Findings: No rash.  Neurological:     General: No focal deficit present.     Mental Status: He is alert.  Psychiatric:        Mood and Affect: Mood normal.      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  Imaging: No results found.  Recent Labs: Lab Results  Component Value Date   WBC 7.8 06/01/2021   HGB 14.8 06/01/2021   PLT 200 06/01/2021   NA 143 10/12/2021   K 4.5 10/12/2021   CL 103 10/12/2021   CO2 21 10/12/2021   GLUCOSE 112 (H) 10/12/2021   BUN 14 10/12/2021   CREATININE 1.01 10/12/2021   BILITOT 0.6 06/01/2021   ALKPHOS 81 11/24/2020   AST 40 (H) 06/01/2021   ALT 68 (H) 06/01/2021   PROT 6.5 06/01/2021   ALBUMIN 4.9 (H) 11/24/2020   CALCIUM 10.0 10/12/2021   GFRAA 112 11/26/2019    Speciality Comments: No specialty comments available.  Procedures:  No procedures performed Allergies: Patient has no known allergies.   Assessment / Plan:     Visit Diagnoses: Polymyalgia rheumatica ([place]) - Plan: Sedimentation rate, C-reactive protein, DISCONTINUED: predniSONE (DELTASONE) 10 MG tablet  Clinical picture is most concerning for possible polymyalgia rheumatica.  Not entirely typical in symptom presentation and has some peripheral joint involvement as well.  Recommend resuming prednisone with a much slower tapering dose and follow-up to  reassess progress.  Rechecking sedimentation rate and CRP today would anticipate some elevation with active symptoms.  Bilateral carpal tunnel syndrome  No obvious synovitis or joint swelling in the hands and wrist to explain nerve compression from inflammatory problem.  Could indicate neuropathy problem but sounds more typical for median nerve compression I would expect that to improve with steroids as well.  Orders: Orders Placed This Encounter  Procedures   Sedimentation rate   C-reactive protein   Meds ordered this encounter  Medications   DISCONTD: predniSONE (DELTASONE) 10 MG tablet    Sig: Take 2 tablets (20 mg total) by mouth daily with breakfast for 7 days, THEN 1.5 tablets (15 mg total) daily with breakfast for 7 days, THEN 1 tablet (10 mg total) daily with breakfast for 14 days.    Dispense:  40 tablet    Refill:  0     Follow-Up Instructions: Return in about 26 days (around 05/30/2021) for New pt PMR/CTS f/u 3-4wks.   Collier Salina, MD  Note - This record has been created using [employer].  Chart creation errors have been sought, but may not always  have been located. Such creation errors do not reflect on  the standard of medical care.

## 2021-05-05 LAB — SEDIMENTATION RATE: Sed Rate: 9 mm/h (ref 0–20)

## 2021-05-05 LAB — C-REACTIVE PROTEIN: CRP: 16.7 mg/L — ABNORMAL HIGH (ref <8.0)

## 2021-05-25 NOTE — Progress Notes (Signed)
? ?Office Visit Note ? ?Patient: Joshuah Minella             ?Date of Birth: 27-Sep-1955           ?MRN: [id number]             ?PCP: Valerie Roys, DO ?Referring: Valerie Roys, DO ?Visit Date: 06/01/2021 ? ? ?Subjective:  ?Follow-up (Improving, stiffness, took last prednisone today) ? ? ?History of Present Illness: Keylen Eckenrode is a 66 y.o. male here for follow up for multiple joint pains with mildly positive RNP antibody positive mild OA on xray imaging.  He noticed a large improvement in symptoms after resuming the prednisone.  Since he decreased down to the 10 mg daily dose he has noticed partial return of some joint stiffness but still much improved.  He is not seeing significant swelling in his hands while taking the prednisone.  He reports some itching sensation on skin at the back of his neck that is new. ? ?Previous HPI ?05/04/2021 ? Agustus Mane is a 66 y.o. male here for evaluation of multiple joint pains with mildly positive RNP antibody positive mild OA on xray imaging. He has had some chronic pain in bilateral hands and feet for years, and with numbness in his hands affecting the first 3 fingers mostly. Pain in the bottoms and posterior of feet most often but these had been pretty constant. However since sometime over 6 months ago started to suffer with increased joint pain and stiffness. Started mostly in bilateral hips and then involving knees and low back and then also in both shoulders. This did not improve much with oral NSAIDs he went to PCP office with initial short prednisone treatment improved his symptoms a lot but returned within a month. He saw Dr. Neomia Dear due to symptoms returning and was treated with IM solumedrol injection and steroid taper again with good response but has gradually started to worsen again, longer after the treatment than the first time. He cannot raise his arms overhead without help. Also feels he is getting weak in lower body unable to bend over easily and cannot  move well. Hand numbness and stiffness is also worsening again with the proximal joint symptoms. He drops items unexpectedly sometimes. There is only slight difference between morning and daytime symptoms. ?  ?12/2020 ?ANA neg ?RNP 1.0 ?Vit D 23.1 ?  ?03/28/21 ?Right shoulder xray ?No acute osseous abnormality. Minimal glenohumeral and moderate AC joint degenerative change. ?Left shoulder xray ?No acute osseous abnormality. Minimal glenohumeral and AC joint degenerative change. ?  ?05/19/20 ?Xray bilateral feet ?1. No fracture or dislocation of the bilateral feet. ?2. Minimal arthrosis of the bilateral first metatarsophalangeal joints and mid feet. ?3. Enthesopathic change about the calcaneus and within the plantar fascia and distal Achilles tendons bilaterally. ? ? ?Review of Systems  ?Constitutional:  Positive for fatigue.  ?HENT:  Positive for mouth dryness.   ?Eyes:  Positive for dryness.  ?Respiratory:  Negative for shortness of breath.   ?Cardiovascular:  Positive for swelling in legs/feet.  ?Gastrointestinal:  Negative for constipation.  ?Endocrine: Negative for excessive thirst.  ?Genitourinary:  Negative for difficulty urinating.  ?Musculoskeletal:  Positive for joint pain, joint pain, joint swelling, muscle weakness, morning stiffness and muscle tenderness.  ?Skin:  Negative for rash.  ?Allergic/Immunologic: Negative for susceptible to infections.  ?Neurological:  Positive for numbness and weakness.  ?Hematological:  Negative for bruising/bleeding tendency.  ?Psychiatric/Behavioral:  Positive for sleep disturbance.   ? ?PMFS History:  ?  Patient Active Problem List  ? Diagnosis Date Noted  ? High risk medication use 06/01/2021  ? Polymyalgia rheumatica ([place]) 05/04/2021  ? Essential hypertension 04/09/2019  ? Bilateral carpal tunnel syndrome 03/27/2018  ? Trigger middle finger of left hand 03/27/2018  ? Neuropathy 12/26/2017  ? Elevated CK 12/01/2017  ? Vitamin D deficiency 12/01/2017  ? Esophageal dysphagia  11/29/2017  ? Hypothyroidism 03/08/2016  ? GERD (gastroesophageal reflux disease) 03/08/2016  ? DJD (degenerative joint disease), lumbar 03/08/2016  ?  ?Past Medical History:  ?Diagnosis Date  ? Allergic rhinitis   ? GERD (gastroesophageal reflux disease)   ? HTN (hypertension)   ? Hypothyroidism   ? Left inguinal hernia 07/2009  ? Polymyalgia rheumatica ([place])   ?  ?Family History  ?Problem Relation Age of Onset  ? Hypertension Mother   ? Hyperlipidemia Mother   ? Diabetes Mother   ? Arthritis Mother   ? Vision loss Mother   ? Thyroid disease Mother   ? Cancer Father   ? Hyperlipidemia Father   ? Arthritis Father   ? Heart disease Father   ? Thyroid disease Brother   ? Hyperlipidemia Maternal Grandmother   ? Hypertension Maternal Grandmother   ? Alzheimer's disease Maternal Grandmother   ? Heart disease Maternal Grandfather   ? Hyperlipidemia Maternal Grandfather   ? Hypertension Maternal Grandfather   ? Cancer Paternal Grandmother   ? Thyroid disease Brother   ? ?Past Surgical History:  ?Procedure Laterality Date  ? BACK SURGERY    ? 1988-- L4-L5 laminectomy  ? COLONOSCOPY N/A 02/16/2021  ? Procedure: COLONOSCOPY;  Surgeon: Jonathon Bellows, MD;  Location: [facility] ENDOSCOPY;  Service: Gastroenterology;  Laterality: N/A;  ? HERNIA REPAIR Left 2011  ? ?Social History  ? ?Social History Narrative  ? Not on file  ? ?Immunization History  ?Administered Date(s) Administered  ? Tdap 03/08/2016  ?  ? ?Objective: ?Vital Signs: BP (!) 147/83 (BP Location: Left Arm, Patient Position: Sitting, Cuff Size: Normal)   Pulse 80   Resp 15   Ht '6\' 2"'$  (1.88 m)   Wt 273 lb (123.8 kg)   BMI 35.05 kg/m?   ? ?Physical Exam ?Constitutional:   ?   Appearance: He is obese.  ?Cardiovascular:  ?   Rate and Rhythm: Normal rate and regular rhythm.  ?Pulmonary:  ?   Effort: Pulmonary effort is normal.  ?   Breath sounds: Normal breath sounds.  ?Musculoskeletal:  ?   Comments: Trace pedal edema  ?Skin: ?   General: Skin is warm and dry.   ?Neurological:  ?   Mental Status: He is alert.  ?Psychiatric:     ?   Mood and Affect: Mood normal.  ?  ? ?Musculoskeletal Exam:  ?Neck full ROM no tenderness ?Shoulders abduction limited to about 40 degrees above horizontal with some pain, tenderness to pressure over and lateral shoulder, no palpable swelling ?Elbows full ROM no tenderness or swelling ?Wrists full ROM no tenderness or swelling ?Fingers full ROM no tenderness or swelling ?Knees full ROM no tenderness or swelling ?Ankles full ROM no tenderness or swelling ? ? ?Investigation: ?No additional findings. ? ?Imaging: ?No results found. ? ?Recent Labs: ?Lab Results  ?Component Value Date  ? WBC 8.9 11/24/2020  ? HGB 15.6 11/24/2020  ? PLT 253 11/24/2020  ? NA 142 12/29/2020  ? K 4.6 12/29/2020  ? CL 104 12/29/2020  ? CO2 24 12/29/2020  ? GLUCOSE 142 (H) 12/29/2020  ? BUN 17 12/29/2020  ?  CREATININE 0.83 12/29/2020  ? BILITOT 0.5 11/24/2020  ? ALKPHOS 81 11/24/2020  ? AST 48 (H) 11/24/2020  ? ALT 66 (H) 11/24/2020  ? PROT 7.4 11/24/2020  ? ALBUMIN 4.9 (H) 11/24/2020  ? CALCIUM 9.1 12/29/2020  ? GFRAA 112 11/26/2019  ? ? ?Speciality Comments: No specialty comments available. ? ?Procedures:  ?No procedures performed ?Allergies: Patient has no known allergies.  ? ?Assessment / Plan:     ?Visit Diagnoses: Polymyalgia rheumatica ([place]) - currently on prednisone taper.  - Plan: C-reactive protein, predniSONE (DELTASONE) 10 MG tablet, methotrexate (RHEUMATREX) 2.5 MG tablet, folic acid (FOLVITE) 1 MG tablet ? ?Good steroid response but he is concerned with prolonged steroid exposure.  He had a moderately elevated CRP rechecking this today.  We will plan to continue at the lower dose to 10 mg prednisone daily for the next month while starting methotrexate 15 mg p.o. weekly and folic acid 1 mg daily.  If tolerating okay at that time will recommend plan to progressively taper prednisone probably at 2.5 mg increments per 2 weeks. ? ?Bilateral carpal tunnel  syndrome ? ?Currently somewhat improved still has some numbness and paresthesia in first 3 digits fingertips of his hands. ? ?High risk medication use - Plan: CBC with Differential/Platelet, COMPLETE METABOLIC PANEL WITH GFR, Hepatiti

## 2021-06-01 ENCOUNTER — Ambulatory Visit: Payer: Managed Care, Other (non HMO) | Admitting: Family Medicine

## 2021-06-01 ENCOUNTER — Ambulatory Visit: Payer: Managed Care, Other (non HMO) | Admitting: Internal Medicine

## 2021-06-01 ENCOUNTER — Encounter: Payer: Self-pay | Admitting: Internal Medicine

## 2021-06-01 ENCOUNTER — Ambulatory Visit
Admission: RE | Admit: 2021-06-01 | Discharge: 2021-06-01 | Disposition: A | Discharge status: Home / Self Care | Payer: Managed Care, Other (non HMO) | Source: Ambulatory Visit | Attending: Internal Medicine | Admitting: Internal Medicine

## 2021-06-01 VITALS — BP 147/83 | HR 80 | Resp 15 | Ht 74.0 in | Wt 273.0 lb

## 2021-06-01 DIAGNOSIS — G5603 Carpal tunnel syndrome, bilateral upper limbs: Secondary | ICD-10-CM

## 2021-06-01 DIAGNOSIS — Z79899 Other long term (current) drug therapy: Secondary | ICD-10-CM

## 2021-06-01 DIAGNOSIS — M353 Polymyalgia rheumatica: Secondary | ICD-10-CM | POA: Diagnosis not present

## 2021-06-01 MED ORDER — FOLIC ACID 1 MG PO TABS: 1.0000 mg | ORAL_TABLET | Freq: Every day | ORAL | 0 refills | Status: DC

## 2021-06-01 MED ORDER — PREDNISONE 10 MG PO TABS: 10.0000 mg | ORAL_TABLET | Freq: Every day | ORAL | 0 refills | Status: DC

## 2021-06-01 MED ORDER — METHOTREXATE 2.5 MG PO TABS: 15.0000 mg | ORAL_TABLET | ORAL | 0 refills | Status: DC

## 2021-06-01 NOTE — Patient Instructions (Signed)
Methotrexate Tablets What is this medication? METHOTREXATE (METH oh TREX ate) treats inflammatory conditions such as arthritis and psoriasis. It works by decreasing inflammation, which can reduce pain and prevent long-term injury to the joints and skin. It may also be used to treat some types of cancer. It works by slowing down the growth of cancer cells. This medicine may be used for other purposes; ask your health care provider or pharmacist if you have questions. COMMON BRAND NAME(S): Rheumatrex, Trexall What should I tell my care team before I take this medication? They need to know if you have any of these conditions: Fluid in the stomach area or lungs If you often drink alcohol Infection or immune system problems Kidney disease or on hemodialysis Liver disease Low blood counts, like low white cell, platelet, or red cell counts Lung disease Radiation therapy Stomach ulcers Ulcerative colitis An unusual or allergic reaction to methotrexate, other medications, foods, dyes, or preservatives Pregnant or trying to get pregnant Breast-feeding How should I use this medication? Take this medication by mouth with a glass of water. Follow the directions on the prescription label. Take your medication at regular intervals. Do not take it more often than directed. Do not stop taking except on your care team's advice. Make sure you know why you are taking this medication and how often you should take it. If this medication is used for a condition that is not cancer, like arthritis or psoriasis, it should be taken weekly, NOT daily. Taking this medication more often than directed can cause serious side effects, even death. Talk to your care team about safe handling and disposal of this medication. You may need to take special precautions. Talk to your care team about the use of this medication in children. While this medication may be prescribed for selected conditions, precautions do  apply. Overdosage: If you think you have taken too much of this medicine contact a poison control center or emergency room at once. NOTE: This medicine is only for you. Do not share this medicine with others. What if I miss a dose? If you miss a dose, talk with your care team. Do not take double or extra doses. What may interact with this medication? Do not take this medication with any of the following: Acitretin This medication may also interact with the following: Aspirin and aspirin-like medications including salicylates Azathioprine Certain antibiotics like penicillins, tetracycline, and chloramphenicol Certain medications that treat or prevent blood clots like warfarin, apixaban, dabigatran, and rivaroxaban Certain medications for stomach problems like esomeprazole, omeprazole, pantoprazole Cyclosporine Dapsone Diuretics Gold Hydroxychloroquine Live virus vaccines Medications for infection like acyclovir, adefovir, amphotericin B, bacitracin, cidofovir, foscarnet, ganciclovir, gentamicin, pentamidine, vancomycin Mercaptopurine NSAIDs, medications for pain and inflammation, like ibuprofen or naproxen Other cytotoxic agents Pamidronate Pemetrexed Penicillamine Phenylbutazone Phenytoin Probenecid Pyrimethamine Retinoids such as isotretinoin and tretinoin Steroid medications like prednisone or cortisone Sulfonamides like sulfasalazine and trimethoprim/sulfamethoxazole Theophylline Zoledronic acid This list may not describe all possible interactions. Give your health care provider a list of all the medicines, herbs, non-prescription drugs, or dietary supplements you use. Also tell them if you smoke, drink alcohol, or use illegal drugs. Some items may interact with your medicine. What should I watch for while using this medication? Avoid alcoholic drinks. This medication can make you more sensitive to the sun. Keep out of the sun. If you cannot avoid being in the sun, wear  protective clothing and use sunscreen. Do not use sun lamps or tanning beds/booths. You may need   blood work done while you are taking this medication. Call your care team for advice if you get a fever, chills or sore throat, or other symptoms of a cold or flu. Do not treat yourself. This medication decreases your body's ability to fight infections. Try to avoid being around people who are sick. This medication may increase your risk to bruise or bleed. Call your care team if you notice any unusual bleeding. Be careful brushing or flossing your teeth or using a toothpick because you may get an infection or bleed more easily. If you have any dental work done, tell your dentist you are receiving this medication. Check with your care team if you get an attack of severe diarrhea, nausea and vomiting, or if you sweat a lot. The loss of too much body fluid can make it dangerous for you to take this medication. Talk to your care team about your risk of cancer. You may be more at risk for certain types of cancers if you take this medication. Do not become pregnant while taking this medication or for 6 months after stopping it. Women should inform their care team if they wish to become pregnant or think they might be pregnant. Men should not father a child while taking this medication and for 3 months after stopping it. There is potential for serious harm to an unborn child. Talk to your care team for more information. Do not breast-feed an infant while taking this medication or for 1 week after stopping it. This medication may make it more difficult to get pregnant or father a child. Talk to your care team if you are concerned about your fertility. What side effects may I notice from receiving this medication? Side effects that you should report to your care team as soon as possible: Allergic reactions--skin rash, itching, hives, swelling of the face, lips, tongue, or throat Blood clot--pain, swelling, or warmth  in the leg, shortness of breath, chest pain Dry cough, shortness of breath or trouble breathing Infection--fever, chills, cough, sore throat, wounds that don't heal, pain or trouble when passing urine, general feeling of discomfort or being unwell Kidney injury--decrease in the amount of urine, swelling of the ankles, hands, or feet Liver injury--right upper belly pain, loss of appetite, nausea, light-colored stool, dark yellow or brown urine, yellowing of the skin or eyes, unusual weakness or fatigue Low red blood cell count--unusual weakness or fatigue, dizziness, headache, trouble breathing Redness, blistering, peeling, or loosening of the skin, including inside the mouth Seizures Unusual bruising or bleeding Side effects that usually do not require medical attention (report to your care team if they continue or are bothersome): Diarrhea Dizziness Hair loss Nausea Pain, redness, or swelling with sores inside the mouth or throat Vomiting This list may not describe all possible side effects. Call your doctor for medical advice about side effects. You may report side effects to FDA at 1-800-FDA-1088. Where should I keep my medication? Keep out of the reach of children and pets. Store at room temperature between 20 and 25 degrees C (68 and 77 degrees F). Protect from light. Get rid of any unused medication after the expiration date. Talk to your care team about how to dispose of unused medication. Special directions may apply. NOTE: This sheet is a summary. It may not cover all possible information. If you have questions about this medicine, talk to your doctor, pharmacist, or health care provider.  2023 Elsevier/Gold Standard (2020-03-13 00:00:00)  

## 2021-06-04 LAB — COMPLETE METABOLIC PANEL WITH GFR
AG Ratio: 2.1 (calc) (ref 1.0–2.5)
ALT: 68 U/L — ABNORMAL HIGH (ref 9–46)
AST: 40 U/L — ABNORMAL HIGH (ref 10–35)
Albumin: 4.4 g/dL (ref 3.6–5.1)
Alkaline phosphatase (APISO): 57 U/L (ref 35–144)
BUN: 21 mg/dL (ref 7–25)
CO2: 29 mmol/L (ref 20–32)
Calcium: 9.2 mg/dL (ref 8.6–10.3)
Chloride: 103 mmol/L (ref 98–110)
Creat: 0.92 mg/dL (ref 0.70–1.35)
Globulin: 2.1 g/dL (calc) (ref 1.9–3.7)
Glucose, Bld: 182 mg/dL — ABNORMAL HIGH (ref 65–99)
Potassium: 4.6 mmol/L (ref 3.5–5.3)
Sodium: 139 mmol/L (ref 135–146)
Total Bilirubin: 0.6 mg/dL (ref 0.2–1.2)
Total Protein: 6.5 g/dL (ref 6.1–8.1)
eGFR: 93 mL/min/{1.73_m2} (ref >=60)

## 2021-06-04 LAB — CBC WITH DIFFERENTIAL/PLATELET
Absolute Monocytes: 686 cells/uL (ref 200–950)
Basophils Absolute: 47 cells/uL (ref 0–200)
Basophils Relative: 0.6 %
Eosinophils Absolute: 242 cells/uL (ref 15–500)
Eosinophils Relative: 3.1 %
HCT: 43.2 % (ref 38.5–50.0)
Hemoglobin: 14.8 g/dL (ref 13.2–17.1)
Lymphs Abs: 2012 cells/uL (ref 850–3900)
MCH: 33.5 pg — ABNORMAL HIGH (ref 27.0–33.0)
MCHC: 34.3 g/dL (ref 32.0–36.0)
MCV: 97.7 fL (ref 80.0–100.0)
MPV: 9.9 fL (ref 7.5–12.5)
Monocytes Relative: 8.8 %
Neutro Abs: 4813 cells/uL (ref 1500–7800)
Neutrophils Relative %: 61.7 %
Platelets: 200 10*3/uL (ref 140–400)
RBC: 4.42 10*6/uL (ref 4.20–5.80)
RDW: 13.1 % (ref 11.0–15.0)
Total Lymphocyte: 25.8 %
WBC: 7.8 10*3/uL (ref 3.8–10.8)

## 2021-06-04 LAB — HEPATITIS B CORE ANTIBODY, IGM: Hep B C IgM: NONREACTIVE

## 2021-06-04 LAB — HEPATITIS B SURFACE ANTIGEN: Hepatitis B Surface Ag: NONREACTIVE

## 2021-06-04 LAB — C-REACTIVE PROTEIN: CRP: 8.6 mg/L — ABNORMAL HIGH (ref <8.0)

## 2021-06-05 ENCOUNTER — Telehealth: Payer: Self-pay | Admitting: Internal Medicine

## 2021-06-05 ENCOUNTER — Encounter: Payer: Self-pay | Admitting: Internal Medicine

## 2021-06-05 NOTE — Telephone Encounter (Signed)
I spoke with Mr. Priola unfortunately he has abnormal baseline liver function tests these have been moderately abnormal in the past but continued to be so.  From a safety standpoint I recommend he did not start the methotrexate to avoid any potential risk for hepatotoxicity.  I recommend just staying at the 10 mg prednisone dose for now and we can follow-up next month to review whether or not we can proceed with tapering this.

## 2021-06-05 NOTE — Telephone Encounter (Signed)
Patient called stating when he picked up his prescription of Methotrexate at [facility] the pharmacist seemed concerned that he was taking it with Omeprazole and printed out paperwork.  Patient requested a return call to let him know if he should stop taking the Omeprazole.  Patient states he is at work today and requested a detailed message be left on his voicemail.

## 2021-06-08 ENCOUNTER — Ambulatory Visit: Payer: Managed Care, Other (non HMO) | Admitting: Family Medicine

## 2021-06-24 ENCOUNTER — Other Ambulatory Visit: Payer: Self-pay | Admitting: Internal Medicine

## 2021-06-24 DIAGNOSIS — M353 Polymyalgia rheumatica: Secondary | ICD-10-CM

## 2021-06-24 NOTE — Telephone Encounter (Signed)
Next Visit: 07/13/2021  Last Visit: 06/01/2021  Last Fill: 06/01/2021   DX:  Polymyalgia rheumatica   Current Dose per office note 06/01/2021:   methotrexate (RHEUMATREX) 2.5 MG tablet      Sig: Take 6 tablets (15 mg total) by mouth once a week.     Labs: 06/01/2021, Glucose 182, AST 40, ALT 68, CRP 8.6, MCH 33.5,   Okay to refill MTX?

## 2021-07-13 ENCOUNTER — Ambulatory Visit: Payer: Managed Care, Other (non HMO) | Admitting: Internal Medicine

## 2021-07-13 ENCOUNTER — Encounter: Payer: Self-pay | Admitting: Internal Medicine

## 2021-07-13 VITALS — BP 146/83 | HR 85 | Resp 16 | Ht 74.0 in | Wt 270.0 lb

## 2021-07-13 DIAGNOSIS — M353 Polymyalgia rheumatica: Secondary | ICD-10-CM | POA: Diagnosis not present

## 2021-07-13 DIAGNOSIS — Z79899 Other long term (current) drug therapy: Secondary | ICD-10-CM

## 2021-07-13 MED ORDER — PREDNISONE 5 MG PO TABS: ORAL_TABLET | ORAL | 0 refills | Status: AC

## 2021-07-14 LAB — C-REACTIVE PROTEIN: CRP: 10 mg/L — ABNORMAL HIGH (ref <8.0)

## 2021-07-17 ENCOUNTER — Other Ambulatory Visit: Payer: Self-pay | Admitting: Family Medicine

## 2021-07-17 ENCOUNTER — Other Ambulatory Visit: Payer: Self-pay | Admitting: Internal Medicine

## 2021-07-17 DIAGNOSIS — M353 Polymyalgia rheumatica: Secondary | ICD-10-CM

## 2021-07-17 NOTE — Telephone Encounter (Signed)
Next Visit: 10/26/2021  Last Visit: 07/13/2021  Last Fill: 06/01/2021  Dx:  Polymyalgia rheumatica   Current Dose per office note on 07/13/2021: not discussed  Okay to refill Folic Acid?

## 2021-07-18 NOTE — Telephone Encounter (Signed)
Patient was not continued on MTX due to liver function, so does not need to take folid acid any more.

## 2021-07-27 ENCOUNTER — Other Ambulatory Visit: Payer: Self-pay | Admitting: Internal Medicine

## 2021-07-27 DIAGNOSIS — M353 Polymyalgia rheumatica: Secondary | ICD-10-CM

## 2021-10-09 ENCOUNTER — Other Ambulatory Visit: Payer: Self-pay | Admitting: Family Medicine

## 2021-10-09 NOTE — Telephone Encounter (Signed)
Requested medication (s) are due for refill today - provider review   Requested medication (s) are on the active medication list -yes  Future visit scheduled -no  Last refill: 04/12/21  Notes to clinic: abnormal  lab warning- ALT/AST  Requested Prescriptions  Pending Prescriptions Disp Refills   meloxicam (MOBIC) 15 MG tablet [Pharmacy Med Name: MELOXICAM 15 MG TABLET] 90 tablet 1    Sig: Take 1 tablet (15 mg total) by mouth daily.     Analgesics:  COX2 Inhibitors Failed - 10/09/2021  2:16 AM      Failed - Manual Review: Labs are only required if the patient has taken medication for more than 8 weeks.      Failed - AST in normal range and within 360 days    AST  Date Value Ref Range Status  06/01/2021 40 (H) 10 - 35 U/L Final         Failed - ALT in normal range and within 360 days    ALT  Date Value Ref Range Status  06/01/2021 68 (H) 9 - 46 U/L Final         Passed - HGB in normal range and within 360 days    Hemoglobin  Date Value Ref Range Status  06/01/2021 14.8 13.2 - 17.1 g/dL Final  11/24/2020 15.6 13.0 - 17.7 g/dL Final         Passed - Cr in normal range and within 360 days    Creat  Date Value Ref Range Status  06/01/2021 0.92 0.70 - 1.35 mg/dL Final         Passed - HCT in normal range and within 360 days    HCT  Date Value Ref Range Status  06/01/2021 43.2 38.5 - 50.0 % Final   Hematocrit  Date Value Ref Range Status  11/24/2020 43.7 37.5 - 51.0 % Final         Passed - eGFR is 30 or above and within 360 days    GFR calc Af Amer  Date Value Ref Range Status  11/26/2019 112 >59 mL/min/1.73 Final    Comment:    **In accordance with recommendations from the NKF-ASN Task force,**   [facility] is in the process of updating its eGFR calculation to the   2021 CKD-EPI creatinine equation that estimates kidney function   without a race variable.    GFR calc non Af Amer  Date Value Ref Range Status  11/26/2019 97 >59 mL/min/1.73 Final   eGFR  Date  Value Ref Range Status  06/01/2021 93 > OR = 60 mL/min/1.69m Final    Comment:    The eGFR is based on the CKD-EPI 2021 equation. To calculate  the new eGFR from a previous Creatinine or Cystatin C result, go to https://www.kidney.org/professionals/ kdoqi/gfr%5Fcalculator   12/29/2020 98 >59 mL/min/1.73 Final         Passed - Patient is not pregnant      Passed - Valid encounter within last 12 months    Recent Outpatient Visits           6 months ago [facility] Megan P, DO   6 months ago Stiffness in joint   [facility] MD   9 months ago Essential hypertension   [facility] [place] Megan P, DO   10 months ago [facility]   1 year  ago Vitamin D deficiency   [facility] [place], [place], DO       Future Appointments             In 2 weeks Rice, Resa Miner, MD [facility] Rheumatology A [facility] [place]               Requested Prescriptions  Pending Prescriptions Disp Refills   meloxicam (MOBIC) 15 MG tablet [Pharmacy Med Name: MELOXICAM 15 MG TABLET] 90 tablet 1    Sig: Take 1 tablet (15 mg total) by mouth daily.     Analgesics:  COX2 Inhibitors Failed - 10/09/2021  2:16 AM      Failed - Manual Review: Labs are only required if the patient has taken medication for more than 8 weeks.      Failed - AST in normal range and within 360 days    AST  Date Value Ref Range Status  06/01/2021 40 (H) 10 - 35 U/L Final         Failed - ALT in normal range and within 360 days    ALT  Date Value Ref Range Status  06/01/2021 68 (H) 9 - 46 U/L Final         Passed - HGB in normal range and within 360 days    Hemoglobin  Date Value Ref Range Status  06/01/2021 14.8 13.2 - 17.1 g/dL Final  11/24/2020 15.6 13.0 - 17.7 g/dL Final         Passed - Cr in normal range  and within 360 days    Creat  Date Value Ref Range Status  06/01/2021 0.92 0.70 - 1.35 mg/dL Final         Passed - HCT in normal range and within 360 days    HCT  Date Value Ref Range Status  06/01/2021 43.2 38.5 - 50.0 % Final   Hematocrit  Date Value Ref Range Status  11/24/2020 43.7 37.5 - 51.0 % Final         Passed - eGFR is 30 or above and within 360 days    GFR calc Af Amer  Date Value Ref Range Status  11/26/2019 112 >59 mL/min/1.73 Final    Comment:    **In accordance with recommendations from the NKF-ASN Task force,**   [facility] is in the process of updating its eGFR calculation to the   2021 CKD-EPI creatinine equation that estimates kidney function   without a race variable.    GFR calc non Af Amer  Date Value Ref Range Status  11/26/2019 97 >59 mL/min/1.73 Final   eGFR  Date Value Ref Range Status  06/01/2021 93 > OR = 60 mL/min/1.11m Final    Comment:    The eGFR is based on the CKD-EPI 2021 equation. To calculate  the new eGFR from a previous Creatinine or Cystatin C result, go to https://www.kidney.org/professionals/ kdoqi/gfr%5Fcalculator   12/29/2020 98 >59 mL/min/1.73 Final         Passed - Patient is not pregnant      Passed - Valid encounter within last 12 months    Recent Outpatient Visits           6 months ago [place], [place]   6 months ago Stiffness in joint   [place]   9 months ago Essential hypertension   [facility] [place] Megan P, DO   10 months  ago Routine general medical examination at a health care facility   [place], [place], DO   1 year ago Vitamin D deficiency   [facility] Valerie Roys, DO       Future Appointments             In 2 weeks Rice, Resa Miner, MD [facility] Rheumatology A [facility] Of [place]

## 2021-10-12 ENCOUNTER — Ambulatory Visit: Payer: Managed Care, Other (non HMO) | Admitting: Family Medicine

## 2021-10-12 ENCOUNTER — Encounter: Payer: Self-pay | Admitting: Family Medicine

## 2021-10-12 VITALS — BP 115/80 | HR 72 | Temp 98.1°F | Wt 267.7 lb

## 2021-10-12 DIAGNOSIS — E559 Vitamin D deficiency, unspecified: Secondary | ICD-10-CM | POA: Diagnosis not present

## 2021-10-12 DIAGNOSIS — I10 Essential (primary) hypertension: Secondary | ICD-10-CM | POA: Diagnosis not present

## 2021-10-12 DIAGNOSIS — E039 Hypothyroidism, unspecified: Secondary | ICD-10-CM | POA: Diagnosis not present

## 2021-10-12 DIAGNOSIS — M25561 Pain in right knee: Secondary | ICD-10-CM | POA: Diagnosis not present

## 2021-10-12 MED ORDER — OMEPRAZOLE 20 MG PO CPDR
DELAYED_RELEASE_CAPSULE | ORAL | 0 refills | Status: DC
Start: 2021-10-12 — End: 2022-01-23

## 2021-10-12 MED ORDER — NAPROXEN 500 MG PO TABS: 500.0000 mg | ORAL_TABLET | Freq: Two times a day (BID) | ORAL | 1 refills | Status: DC

## 2021-10-12 MED ORDER — LOSARTAN POTASSIUM 100 MG PO TABS
100.0000 mg | ORAL_TABLET | Freq: Every day | ORAL | 1 refills | Status: DC
Start: 2021-10-12 — End: 2022-04-03

## 2021-10-12 NOTE — Assessment & Plan Note (Signed)
Rechecking labs today. Await results. Treat as needed.  °

## 2021-10-12 NOTE — Progress Notes (Signed)
BP 115/80   Pulse 72   Temp 98.1 F (36.7 C)   Wt 267 lb 11.2 oz (121.4 kg)   SpO2 98%   BMI 34.37 kg/m    Subjective:    Patient ID: Nicholas Wall, male    DOB: [birth year], 66 y.o.   MRN: [id number]  HPI: Nicholas Wall is a 66 y.o. male  Chief Complaint  Patient presents with   Knee Pain    Patient states he has right knee pain for about 1-2 months. Per patient pain is daily and worsens with walking and working. Patient sees rheumatology for arthritis and takes meloxicam daily, but does not help knee pain.    Hypertension    Patient missed follow up in May, will be running out of medication soon.    Hypothyroidism   HYPERTENSION  Hypertension status: controlled  Satisfied with current treatment? yes Duration of hypertension: chronic BP monitoring frequency:  not checking BP medication side effects:  no Medication compliance: excellent compliance Previous BP meds:losartan Aspirin: no Recurrent headaches: no Visual changes: no Palpitations: no Dyspnea: no Chest pain: no Lower extremity edema: no Dizzy/lightheaded: no  HYPOTHYROIDISM Thyroid control status:controlled Satisfied with current treatment? yes Medication side effects: no Medication compliance: excellent compliance Recent dose adjustment:no Fatigue: no Cold intolerance: no Heat intolerance: no Weight gain: no Weight loss: no Constipation: no Diarrhea/loose stools: no Palpitations: no Lower extremity edema: no Anxiety/depressed mood: no  KNEE PAIN Duration: about 2 months Involved knee: right Mechanism of injury:  hike Location:medial aspect R knee Onset: sudden Severity: moderate  Quality:  aching Frequency: intermittent Radiation: no Aggravating factors: climbing, walking  Alleviating factors: nothing  Status: worse Treatments attempted:  meloxicam, rest, ice, and heat  Relief with NSAIDs?:  no Weakness with weight bearing or walking: no Sensation of giving way: no Locking:  no Popping: no Bruising: no Swelling: no Redness: no Paresthesias/decreased sensation: no Fevers: no  Relevant past medical, surgical, family and social history reviewed and updated as indicated. Interim medical history since our last visit reviewed. Allergies and medications reviewed and updated.  Review of Systems  Constitutional: Negative.   Respiratory: Negative.    Cardiovascular: Negative.   Gastrointestinal: Negative.   Musculoskeletal:  Positive for arthralgias and joint swelling. Negative for back pain, gait problem, myalgias, neck pain and neck stiffness.  Skin: Negative.   Neurological: Negative.   Psychiatric/Behavioral: Negative.      Per HPI unless specifically indicated above     Objective:    BP 115/80   Pulse 72   Temp 98.1 F (36.7 C)   Wt 267 lb 11.2 oz (121.4 kg)   SpO2 98%   BMI 34.37 kg/m   Wt Readings from Last 3 Encounters:  10/12/21 267 lb 11.2 oz (121.4 kg)  07/13/21 270 lb (122.5 kg)  06/01/21 273 lb (123.8 kg)    Physical Exam Vitals and nursing note reviewed.  Constitutional:      General: He is not in acute distress.    Appearance: Normal appearance. He is obese. He is not ill-appearing, toxic-appearing or diaphoretic.  HENT:     Head: Normocephalic and atraumatic.     Right Ear: External ear normal.     Left Ear: External ear normal.     Nose: Nose normal.     Mouth/Throat:     Mouth: Mucous membranes are moist.     Pharynx: Oropharynx is clear.  Eyes:     General: No scleral icterus.  Right eye: No discharge.        Left eye: No discharge.     Extraocular Movements: Extraocular movements intact.     Conjunctiva/sclera: Conjunctivae normal.     Pupils: Pupils are equal, round, and reactive to light.  Cardiovascular:     Rate and Rhythm: Normal rate and regular rhythm.     Pulses: Normal pulses.     Heart sounds: Normal heart sounds. No murmur heard.    No friction rub. No gallop.  Pulmonary:     Effort: Pulmonary  effort is normal. No respiratory distress.     Breath sounds: Normal breath sounds. No stridor. No wheezing, rhonchi or rales.  Chest:     Chest wall: No tenderness.  Musculoskeletal:        General: Swelling (mild effusion) and tenderness present. No deformity or signs of injury. Normal range of motion.     Cervical back: Normal range of motion and neck supple.     Right lower leg: No edema.     Left lower leg: No edema.  Skin:    General: Skin is warm and dry.     Capillary Refill: Capillary refill takes less than 2 seconds.     Coloration: Skin is not jaundiced or pale.     Findings: No bruising, erythema, lesion or rash.  Neurological:     General: No focal deficit present.     Mental Status: He is alert and oriented to person, place, and time. Mental status is at baseline.  Psychiatric:        Mood and Affect: Mood normal.        Behavior: Behavior normal.        Thought Content: Thought content normal.        Judgment: Judgment normal.     Results for orders placed or performed in visit on 07/13/21  C-reactive protein  Result Value Ref Range   CRP 10.0 (H) <8.0 mg/L      Assessment & Plan:   Problem List Items Addressed This Visit       Cardiovascular and Mediastinum   Essential hypertension    Under good control on current regimen. Continue current regimen. Continue to monitor. Call with any concerns. Refills given. Labs drawn today.       Relevant Medications   losartan (COZAAR) 100 MG tablet   Other Relevant Orders   Basic metabolic panel     Endocrine   Hypothyroidism    Rechecking labs today. Await results. Treat as needed.       Relevant Orders   TSH     Other   Vitamin D deficiency    Rechecking labs today. Await results. Treat as needed.       Relevant Orders   VITAMIN D 25 Hydroxy (Vit-D Deficiency, Fractures)   Other Visit Diagnoses     Acute pain of right knee    -  Primary   Declines x-ray. Will change meloxicam to naproxen. Has  follow up with rheumatology in 2 weeks. Call with any concerns.         Follow up plan: Return in about 6 months (around 04/12/2022) for physical.

## 2021-10-12 NOTE — Assessment & Plan Note (Signed)
Under good control on current regimen. Continue current regimen. Continue to monitor. Call with any concerns. Refills given. Labs drawn today.   

## 2021-10-12 NOTE — Progress Notes (Signed)
Office Visit Note  Patient: Nicholas Wall             Date of Birth: 10/11/1955           MRN: [id number]             PCP: Valerie Roys, DO Referring: Valerie Roys, DO Visit Date: 10/26/2021   Subjective:  Follow-up (Fatigue and weakness. Last dose of prednisone 8/18 and symptoms did not come back as bad. Chronic pain in right knee. Other pains come and go, especially feet and hands. )   History of Present Illness: Nicholas Wall is a 66 y.o. male here for follow up for PMR current off prednisone since mid August. He felt okay while tapering this down to 2.5 mg daily dose but since stopping has increase in joint pains in hands and feet and right knee. He was prescribed naproxen 500 mg and taking this twice daily helps pain but still having severe fatigue. By early afternoon is is exhausted every day and has no improvement with the naproxen.  Previous HPI 07/13/2021  Nicholas Wall is a 66 y.o. male here for follow up for PMR he was unable to start MTX as planned due to abnormal LFTs. He finished taking the prednisone 10 mg daily about 8 days ago so is off this medication for now. He notices increasing stiffness in shoulders and hips since finishing the medication. He also feels numbness in both hands worst in fingertips in the first 4 digits. Sometimes drops items unexpectedly but no obvious change in grip at other times.   Previous HPI 06/01/21 Nicholas Wall is a 66 y.o. male here for follow up for multiple joint pains with mildly positive RNP antibody positive mild OA on xray imaging.  He noticed a large improvement in symptoms after resuming the prednisone.  Since he decreased down to the 10 mg daily dose he has noticed partial return of some joint stiffness but still much improved.  He is not seeing significant swelling in his hands while taking the prednisone.  He reports some itching sensation on skin at the back of his neck that is new.   Previous HPI 05/04/2021 Nicholas Wall is a 66 y.o. male here for evaluation of multiple joint pains with mildly positive RNP antibody positive mild OA on xray imaging. He has had some chronic pain in bilateral hands and feet for years, and with numbness in his hands affecting the first 3 fingers mostly. Pain in the bottoms and posterior of feet most often but these had been pretty constant. However since sometime over 6 months ago started to suffer with increased joint pain and stiffness. Started mostly in bilateral hips and then involving knees and low back and then also in both shoulders. This did not improve much with oral NSAIDs he went to PCP office with initial short prednisone treatment improved his symptoms a lot but returned within a month. He saw Dr. Neomia Dear due to symptoms returning and was treated with IM solumedrol injection and steroid taper again with good response but has gradually started to worsen again, longer after the treatment than the first time. He cannot raise his arms overhead without help. Also feels he is getting weak in lower body unable to bend over easily and cannot move well. Hand numbness and stiffness is also worsening again with the proximal joint symptoms. He drops items unexpectedly sometimes. There is only slight difference between morning and daytime symptoms.   12/2020 ANA neg RNP  1.0 Vit D 23.1   03/28/21 Right shoulder xray No acute osseous abnormality. Minimal glenohumeral and moderate AC joint degenerative change. Left shoulder xray No acute osseous abnormality. Minimal glenohumeral and AC joint degenerative change.   05/19/20 Xray bilateral feet 1. No fracture or dislocation of the bilateral feet. 2. Minimal arthrosis of the bilateral first metatarsophalangeal joints and mid feet. 3. Enthesopathic change about the calcaneus and within the plantar fascia and distal Achilles tendons bilaterally.   Review of Systems  Constitutional:  Positive for fatigue.  HENT:  Positive for mouth  dryness. Negative for mouth sores.   Eyes:  Negative for dryness.  Respiratory:  Negative for shortness of breath.   Cardiovascular:  Negative for chest pain and palpitations.  Gastrointestinal:  Negative for blood in stool, constipation and diarrhea.  Endocrine: Negative for increased urination.  Genitourinary:  Negative for involuntary urination.  Musculoskeletal:  Positive for joint pain, joint pain, joint swelling, myalgias, muscle weakness, morning stiffness, muscle tenderness and myalgias. Negative for gait problem.  Skin:  Negative for color change, rash, hair loss and sensitivity to sunlight.  Allergic/Immunologic: Negative for susceptible to infections.  Neurological:  Negative for dizziness and headaches.  Hematological:  Negative for swollen glands.  Psychiatric/Behavioral:  Positive for sleep disturbance. Negative for depressed mood. The patient is not nervous/anxious.     PMFS History:  Patient Active Problem List   Diagnosis Date Noted   Other fatigue 10/26/2021   High risk medication use 06/01/2021   Polymyalgia rheumatica ([place]) 05/04/2021   Essential hypertension 04/09/2019   Bilateral carpal tunnel syndrome 03/27/2018   Trigger middle finger of left hand 03/27/2018   Neuropathy 12/26/2017   Elevated CK 12/01/2017   Vitamin D deficiency 12/01/2017   Esophageal dysphagia 11/29/2017   Hypothyroidism 03/08/2016   GERD (gastroesophageal reflux disease) 03/08/2016   DJD (degenerative joint disease), lumbar 03/08/2016    Past Medical History:  Diagnosis Date   Allergic rhinitis    GERD (gastroesophageal reflux disease)    HTN (hypertension)    Hypothyroidism    Left inguinal hernia 07/2009   Polymyalgia rheumatica ([place])     Family History  Problem Relation Age of Onset   Hypertension Mother    Hyperlipidemia Mother    Diabetes Mother    Arthritis Mother    Vision loss Mother    Thyroid disease Mother    Cancer Father    Hyperlipidemia Father    Arthritis  Father    Heart disease Father    Thyroid disease Brother    Hyperlipidemia Maternal Grandmother    Hypertension Maternal Grandmother    Alzheimer's disease Maternal Grandmother    Heart disease Maternal Grandfather    Hyperlipidemia Maternal Grandfather    Hypertension Maternal Grandfather    Cancer Paternal Grandmother    Thyroid disease Brother    Past Surgical History:  Procedure Laterality Date   BACK SURGERY     1988-- L4-L5 laminectomy   COLONOSCOPY N/A 02/16/2021   Procedure: COLONOSCOPY;  Surgeon: Jonathon Bellows, MD;  Location[facility] ENDOSCOPY;  Service: Gastroenterology;  Laterality: N/A;   HERNIA REPAIR Left 2011   Social History   Social History Narrative   Not on file   Immunization History  Administered Date(s) Administered   Tdap 03/08/2016     Objective: Vital Signs: BP (!) 144/88 (BP Location: Left Arm, Patient Position: Sitting, Cuff Size: Normal)   Pulse 65   Resp 13   Ht '6\' 2"'$  (1.88 m)   Wt 269 lb  12.8 oz (122.4 kg)   BMI 34.64 kg/m    Physical Exam Constitutional:      Appearance: He is obese.  Cardiovascular:     Rate and Rhythm: Normal rate and regular rhythm.  Pulmonary:     Effort: Pulmonary effort is normal.     Breath sounds: Normal breath sounds.  Skin:    General: Skin is warm and dry.     Findings: No rash.  Neurological:     Mental Status: He is alert.  Psychiatric:        Mood and Affect: Mood normal.      Musculoskeletal Exam:  Shoulders abduction in active ROM limited, with tenderness to pressure no palpable swelling Elbows full ROM no tenderness or swelling Wrists full ROM no tenderness or swelling Fingers full ROM, no synovitis, flexor tendon nodule Knees full ROM no tenderness or swelling Ankles full ROM no tenderness or swelling   Investigation: No additional findings.  Imaging: No results found.  Recent Labs: Lab Results  Component Value Date   WBC 8.7 10/26/2021   HGB 15.2 10/26/2021   PLT 216 10/26/2021    NA 143 10/12/2021   K 4.5 10/12/2021   CL 103 10/12/2021   CO2 21 10/12/2021   GLUCOSE 112 (H) 10/12/2021   BUN 14 10/12/2021   CREATININE 1.01 10/12/2021   BILITOT 0.6 06/01/2021   ALKPHOS 81 11/24/2020   AST 40 (H) 06/01/2021   ALT 68 (H) 06/01/2021   PROT 6.5 06/01/2021   ALBUMIN 4.9 (H) 11/24/2020   CALCIUM 10.0 10/12/2021   GFRAA 112 11/26/2019    Speciality Comments: No specialty comments available.  Procedures:  No procedures performed Allergies: Patient has no known allergies.   Assessment / Plan:     Visit Diagnoses: Polymyalgia rheumatica ([place]) - Plan: C-reactive protein, CBC with Differential/Platelet, Sedimentation rate  Symptoms did not clearly return in the same pattern like we saw before after short prednisone tapering before.  I am not sure that this indicates any return of PMR we will check sedimentation rate and CRP and CBC for any evidence of disease activity.  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  This may have been suppressed during treatment with prednisone.  If all his inflammatory markers are normal would plan to just observe off steroids for now versus resuming at low-dose.  Other fatigue  Not clear if this is related to return of inflammation as above could also be related to tapering off the steroids in which case would expect further improvement over time.  Alternatively may be from unrelated factors.  Orders: Orders Placed This Encounter  Procedures   C-reactive protein   CBC with Differential/Platelet   Sedimentation rate   No orders of the defined types were placed in this encounter.    Follow-Up Instructions: Return in about 3 months (around 01/26/2022) for PMR/OA/?fatigue f/u 46mo.   CCollier Salina MD  Note - This record has been created using [employer]  Chart creation errors have been sought, but may not always  have been located. Such  creation errors do not reflect on  the standard of medical care.

## 2021-10-13 LAB — BASIC METABOLIC PANEL
BUN/Creatinine Ratio: 14 (ref 10–24)
BUN: 14 mg/dL (ref 8–27)
CO2: 21 mmol/L (ref 20–29)
Calcium: 10 mg/dL (ref 8.6–10.2)
Chloride: 103 mmol/L (ref 96–106)
Creatinine, Ser: 1.01 mg/dL (ref 0.76–1.27)
Glucose: 112 mg/dL — ABNORMAL HIGH (ref 70–99)
Potassium: 4.5 mmol/L (ref 3.5–5.2)
Sodium: 143 mmol/L (ref 134–144)
eGFR: 84 mL/min/{1.73_m2} (ref >59)

## 2021-10-13 LAB — TSH: TSH: 2.56 u[IU]/mL (ref 0.450–4.500)

## 2021-10-13 LAB — VITAMIN D 25 HYDROXY (VIT D DEFICIENCY, FRACTURES): Vit D, 25-Hydroxy: 32.5 ng/mL (ref 30.0–100.0)

## 2021-10-15 ENCOUNTER — Other Ambulatory Visit: Payer: Self-pay | Admitting: Family Medicine

## 2021-10-15 MED ORDER — LEVOTHYROXINE SODIUM 88 MCG PO TABS: 88.0000 ug | ORAL_TABLET | Freq: Every day | ORAL | 3 refills | Status: DC

## 2021-10-26 ENCOUNTER — Ambulatory Visit: Payer: Managed Care, Other (non HMO) | Attending: Internal Medicine | Admitting: Internal Medicine

## 2021-10-26 ENCOUNTER — Encounter: Payer: Self-pay | Admitting: Internal Medicine

## 2021-10-26 VITALS — BP 144/88 | HR 65 | Resp 13 | Ht 74.0 in | Wt 269.8 lb

## 2021-10-26 DIAGNOSIS — M353 Polymyalgia rheumatica: Secondary | ICD-10-CM | POA: Diagnosis not present

## 2021-10-26 DIAGNOSIS — R5383 Other fatigue: Secondary | ICD-10-CM

## 2021-10-26 DIAGNOSIS — Z79899 Other long term (current) drug therapy: Secondary | ICD-10-CM

## 2021-10-27 LAB — CBC WITH DIFFERENTIAL/PLATELET
Absolute Monocytes: 783 cells/uL (ref 200–950)
Basophils Absolute: 61 cells/uL (ref 0–200)
Basophils Relative: 0.7 %
Eosinophils Absolute: 348 cells/uL (ref 15–500)
Eosinophils Relative: 4 %
HCT: 43.1 % (ref 38.5–50.0)
Hemoglobin: 15.2 g/dL (ref 13.2–17.1)
Lymphs Abs: 2175 cells/uL (ref 850–3900)
MCH: 33.2 pg — ABNORMAL HIGH (ref 27.0–33.0)
MCHC: 35.3 g/dL (ref 32.0–36.0)
MCV: 94.1 fL (ref 80.0–100.0)
MPV: 10 fL (ref 7.5–12.5)
Monocytes Relative: 9 %
Neutro Abs: 5333 cells/uL (ref 1500–7800)
Neutrophils Relative %: 61.3 %
Platelets: 216 10*3/uL (ref 140–400)
RBC: 4.58 10*6/uL (ref 4.20–5.80)
RDW: 12.3 % (ref 11.0–15.0)
Total Lymphocyte: 25 %
WBC: 8.7 10*3/uL (ref 3.8–10.8)

## 2021-10-27 LAB — C-REACTIVE PROTEIN: CRP: 5.8 mg/L (ref <8.0)

## 2021-10-27 LAB — SEDIMENTATION RATE: Sed Rate: 9 mm/h (ref 0–20)

## 2021-10-29 NOTE — Progress Notes (Signed)
Lab results show normal inflammatory markers still despite his symptoms and fatigue so I recommend we try to continue for longer off the steroids at this time.  He can continue nonsteroidal anti-inflammatory medicine like naproxen as needed.  If there is no improvement in fatigue with a longer time off of steroids he can follow-up to recheck for signs of inflammation or other underlying causes.

## 2022-01-23 ENCOUNTER — Other Ambulatory Visit: Payer: Self-pay | Admitting: Family Medicine

## 2022-01-23 NOTE — Telephone Encounter (Signed)
Requested Prescriptions  Pending Prescriptions Disp Refills   omeprazole (PRILOSEC) 20 MG capsule [Pharmacy Med Name: OMEPRAZOLE DR 20 MG CAPSULE] 90 capsule 0    Sig: TAKE 1 CAPSULE BY MOUTH EVERY DAY     Gastroenterology: Proton Pump Inhibitors Passed - 01/23/2022  1:24 AM      Passed - Valid encounter within last 12 months    Recent Outpatient Visits           3 months ago Acute pain of right knee   [place], Megan P, DO   9 months ago [place], Megan P, DO   10 months ago Stiffness in joint   [place] Vigg, Avanti, MD   1 year ago Essential hypertension   [place], Megan P, DO   1 year ago Routine general medical examination at a health care facility   [place], Barb Merino, DO       Future Appointments             In 2 days [place], Resa Miner, MD [place] [place]   In 2 months Wyn[place] Emery, Barb Merino, DO [employer], [facility]

## 2022-01-25 ENCOUNTER — Ambulatory Visit: Payer: Managed Care, Other (non HMO) | Admitting: Internal Medicine

## 2022-02-12 ENCOUNTER — Encounter: Payer: Self-pay | Admitting: Family Medicine

## 2022-04-03 ENCOUNTER — Other Ambulatory Visit: Payer: Self-pay | Admitting: Family Medicine

## 2022-04-03 NOTE — Telephone Encounter (Signed)
Requested Prescriptions  Pending Prescriptions Disp Refills   naproxen (NAPROSYN) 500 MG tablet [Pharmacy Med Name: NAPROXEN 500 MG TABLET] 180 tablet 0    Sig: TAKE 1 TABLET BY MOUTH 2 TIMES DAILY WITH A MEAL.     Analgesics:  NSAIDS Failed - 04/03/2022  1:46 AM      Failed - Manual Review: Labs are only required if the patient has taken medication for more than 8 weeks.      Passed - Cr in normal range and within 360 days    Creat  Date Value Ref Range Status  06/01/2021 0.92 0.70 - 1.35 mg/dL Final   Creatinine, Ser  Date Value Ref Range Status  10/12/2021 1.01 0.76 - 1.27 mg/dL Final         Passed - HGB in normal range and within 360 days    Hemoglobin  Date Value Ref Range Status  10/26/2021 15.2 13.2 - 17.1 g/dL Final  11/24/2020 15.6 13.0 - 17.7 g/dL Final         Passed - PLT in normal range and within 360 days    Platelets  Date Value Ref Range Status  10/26/2021 216 140 - 400 Thousand/uL Final  11/24/2020 253 150 - 450 x10E3/uL Final         Passed - HCT in normal range and within 360 days    HCT  Date Value Ref Range Status  10/26/2021 43.1 38.5 - 50.0 % Final   Hematocrit  Date Value Ref Range Status  11/24/2020 43.7 37.5 - 51.0 % Final         Passed - eGFR is 30 or above and within 360 days    GFR calc Af Amer  Date Value Ref Range Status  11/26/2019 112 >59 mL/min/1.73 Final    Comment:    **In accordance with recommendations from the NKF-ASN Task force,**   [facility] is in the process of updating its eGFR calculation to the   2021 CKD-EPI creatinine equation that estimates kidney function   without a race variable.    GFR calc non Af Amer  Date Value Ref Range Status  11/26/2019 97 >59 mL/min/1.73 Final   eGFR  Date Value Ref Range Status  10/12/2021 84 >59 mL/min/1.73 Final         Passed - Patient is not pregnant      Passed - Valid encounter within last 12 months    Recent Outpatient Visits           5 months ago Acute pain of  right knee   [place], DO   11 months ago [place], DO   1 year ago Stiffness in joint   [place] Vigg, Avanti, MD   1 year ago Essential hypertension   [place], [place], DO   1 year ago Routine general medical examination at a health care facility   [facility] Valerie Roys, DO       Future Appointments             In 2 days Valerie Roys, DO [place], [facility]             losartan (COZAAR) 100 MG tablet [Pharmacy Med Name: LOSARTAN POTASSIUM 100 MG TAB] 90 tablet 0    Sig: TAKE 1 TABLET BY MOUTH EVERY DAY  Cardiovascular:  Angiotensin Receptor Blockers Failed - 04/03/2022  1:46 AM      Failed - Last BP in normal range    BP Readings from Last 1 Encounters:  10/26/21 (!) 144/88         Passed - Cr in normal range and within 180 days    Creat  Date Value Ref Range Status  06/01/2021 0.92 0.70 - 1.35 mg/dL Final   Creatinine, Ser  Date Value Ref Range Status  10/12/2021 1.01 0.76 - 1.27 mg/dL Final         Passed - K in normal range and within 180 days    Potassium  Date Value Ref Range Status  10/12/2021 4.5 3.5 - 5.2 mmol/L Final         Passed - Patient is not pregnant      Passed - Valid encounter within last 6 months    Recent Outpatient Visits           5 months ago Acute pain of right knee   [place], Megan P, DO   11 months ago [place], Megan P, DO   1 year ago Stiffness in joint   [place] Vigg, Avanti, MD   1 year ago Essential hypertension   [place], [place], DO   1 year ago Routine general medical examination at a health care facility   [facility]  Valerie Roys, DO       Future Appointments             In 2 days Valerie Roys, DO [place], [facility]

## 2022-04-05 ENCOUNTER — Encounter: Payer: Self-pay | Admitting: Family Medicine

## 2022-04-05 ENCOUNTER — Ambulatory Visit (INDEPENDENT_AMBULATORY_CARE_PROVIDER_SITE_OTHER): Payer: Managed Care, Other (non HMO) | Admitting: Family Medicine

## 2022-04-05 VITALS — BP 149/80 | HR 60 | Temp 98.3°F | Ht 73.5 in | Wt 275.5 lb

## 2022-04-05 DIAGNOSIS — E039 Hypothyroidism, unspecified: Secondary | ICD-10-CM

## 2022-04-05 DIAGNOSIS — Z0000 Encounter for general adult medical examination without abnormal findings: Secondary | ICD-10-CM | POA: Diagnosis not present

## 2022-04-05 DIAGNOSIS — I10 Essential (primary) hypertension: Secondary | ICD-10-CM | POA: Diagnosis not present

## 2022-04-05 DIAGNOSIS — E559 Vitamin D deficiency, unspecified: Secondary | ICD-10-CM

## 2022-04-05 LAB — MICROALBUMIN, URINE WAIVED
Creatinine, Urine Waived: 100 mg/dL (ref 10–300)
Microalb, Ur Waived: 10 mg/L (ref 0–19)
Microalb/Creat Ratio: <30 mg/g (ref <30)

## 2022-04-05 LAB — URINALYSIS, ROUTINE W REFLEX MICROSCOPIC
Bilirubin, UA: NEGATIVE
Glucose, UA: NEGATIVE
Ketones, UA: NEGATIVE
Leukocytes,UA: NEGATIVE
Nitrite, UA: NEGATIVE
Protein,UA: NEGATIVE
RBC, UA: NEGATIVE
Specific Gravity, UA: 1.015 (ref 1.005–1.030)
Urobilinogen, Ur: 0.2 mg/dL (ref 0.2–1.0)
pH, UA: 5.5 (ref 5.0–7.5)

## 2022-04-05 MED ORDER — LOSARTAN POTASSIUM 100 MG PO TABS: 100.0000 mg | ORAL_TABLET | Freq: Every day | ORAL | 1 refills | Status: DC

## 2022-04-05 MED ORDER — OMEPRAZOLE 20 MG PO CPDR: 20.0000 mg | DELAYED_RELEASE_CAPSULE | Freq: Every day | ORAL | 1 refills | Status: DC

## 2022-04-05 MED ORDER — NAPROXEN 500 MG PO TABS: 500.0000 mg | ORAL_TABLET | Freq: Two times a day (BID) | ORAL | 1 refills | Status: DC

## 2022-04-05 NOTE — Assessment & Plan Note (Signed)
Rechecking labs today. Await results. Treat as needed.  °

## 2022-04-05 NOTE — Progress Notes (Signed)
BP (!) 149/80 (BP Location: Left Arm, Cuff Size: Normal)   Pulse 60   Temp 98.3 F (36.8 C) (Oral)   Ht 6' 1.5" (1.867 m)   Wt 275 lb 8 oz (125 kg)   SpO2 98%   BMI 35.85 kg/m    Subjective:    Patient ID: Nicholas Wall, male    DOB: 12-06-1955, 67 y.o.   MRN: [id number]  HPI: Nicholas Wall is a 67 y.o. male presenting on 04/05/2022 for comprehensive medical examination. Current medical complaints include:  HYPERTENSION  Hypertension status: controlled  Satisfied with current treatment? yes Duration of hypertension: chronic BP monitoring frequency:  not checking BP medication side effects:  no Medication compliance: excellent compliance Previous BP meds: losartan Aspirin: no Recurrent headaches: no Visual changes: no Palpitations: no Dyspnea: no Chest pain: no Lower extremity edema: no Dizzy/lightheaded: no  HYPOTHYROIDISM Thyroid control status:controlled Satisfied with current treatment? yes Medication side effects: no Medication compliance: excellent compliance Recent dose adjustment:no Fatigue: yes Cold intolerance: no Heat intolerance: no Weight gain: no Weight loss: no Constipation: no Diarrhea/loose stools: no Palpitations: no Lower extremity edema: no Anxiety/depressed mood: no  He currently lives with: wife Interim Problems from his last visit: no  Depression Screen done today and results listed below:     04/05/2022    3:26 PM 10/12/2021    3:22 PM 03/26/2021   11:11 AM 12/29/2020    8:57 AM 11/24/2020    9:14 AM  Depression screen PHQ 2/9  Decreased Interest 0 1 1 1  0  Down, Depressed, Hopeless 0 1 1 1  0  PHQ - 2 Score 0 2 2 2  0  Altered sleeping 1 1 3 3  0  Tired, decreased energy 1 2 3 2  0  Change in appetite 0 0 0 0 0  Feeling bad or failure about yourself  0 0 1 0 0  Trouble concentrating 0 0 0 0 0  Moving slowly or fidgety/restless 0 0 0 0 0  Suicidal thoughts 0 0 0 0 0  PHQ-9 Score 2 5 9 7  0  Difficult doing work/chores Not  difficult at all Somewhat difficult Somewhat difficult  Not difficult at all   Past Medical History:  Past Medical History:  Diagnosis Date   Allergic rhinitis    GERD (gastroesophageal reflux disease)    HTN (hypertension)    Hypothyroidism    Left inguinal hernia 07/2009   Polymyalgia rheumatica ([place])     Surgical History:  Past Surgical History:  Procedure Laterality Date   [place]-- L4-L5 laminectomy   COLONOSCOPY N/A 02/16/2021   Procedure: COLONOSCOPY;  Surgeon: Jonathon Bellows, MD;  Location: [facility];  Service: Gastroenterology;  Laterality: N/A;   HERNIA REPAIR Left 2011    Medications:  Current Outpatient Medications on File Prior to Visit  Medication Sig   levothyroxine (SYNTHROID) 88 MCG tablet Take 1 tablet (88 mcg total) by mouth daily.   No current facility-administered medications on file prior to visit.    Allergies:  No Known Allergies  Social History:  Social History   Socioeconomic History   Marital status: Married    Spouse name: Not on file   Number of children: Not on file   Years of education: Not on file   Highest education level: Not on file  Occupational History   Not on file  Tobacco Use   Smoking status: Never    Passive exposure: Never   Smokeless tobacco: Former  Types: Chew, Snuff  Vaping Use   Vaping Use: Never used  Substance and Sexual Activity   Alcohol use: Yes    Alcohol/week: 10.0 standard drinks of alcohol    Types: 10 Cans of beer per week   Drug use: No   Sexual activity: Not Currently  Other Topics Concern   Not on file  Social History Narrative   Not on file   Social Determinants of Health   Financial Resource Strain: Not on file  Food Insecurity: Not on file  Transportation Needs: Not on file  Physical Activity: Not on file  Stress: Not on file  Social Connections: Not on file  Intimate Partner Violence: Not on file   Social History   Tobacco Use  Smoking Status Never   Passive  exposure: Never  Smokeless Tobacco Former   Types: Chew, Snuff   Social History   Substance and Sexual Activity  Alcohol Use Yes   Alcohol/week: 10.0 standard drinks of alcohol   Types: 10 Cans of beer per week    Family History:  Family History  Problem Relation Age of Onset   Hypertension Mother    Hyperlipidemia Mother    Diabetes Mother    Arthritis Mother    Vision loss Mother    Thyroid disease Mother    Cancer Father    Hyperlipidemia Father    Arthritis Father    Heart disease Father    Thyroid disease Brother    Hyperlipidemia Maternal Grandmother    Hypertension Maternal Grandmother    Alzheimer's disease Maternal Grandmother    Heart disease Maternal Grandfather    Hyperlipidemia Maternal Grandfather    Hypertension Maternal Grandfather    Cancer Paternal Grandmother    Thyroid disease Brother     Past medical history, surgical history, medications, allergies, family history and social history reviewed with patient today and changes made to appropriate areas of the chart.   Review of Systems  Constitutional:  Positive for malaise/fatigue. Negative for chills, diaphoresis, fever and weight loss.  HENT: Negative.    Eyes: Negative.   Respiratory: Negative.    Cardiovascular: Negative.   Gastrointestinal: Negative.   Genitourinary: Negative.   Musculoskeletal: Negative.   Skin: Negative.   Neurological: Negative.   Endo/Heme/Allergies: Negative.   Psychiatric/Behavioral: Negative.     All other ROS negative except what is listed above and in the HPI.      Objective:    BP (!) 149/80 (BP Location: Left Arm, Cuff Size: Normal)   Pulse 60   Temp 98.3 F (36.8 C) (Oral)   Ht 6' 1.5" (1.867 m)   Wt 275 lb 8 oz (125 kg)   SpO2 98%   BMI 35.85 kg/m   Wt Readings from Last 3 Encounters:  04/05/22 275 lb 8 oz (125 kg)  10/26/21 269 lb 12.8 oz (122.4 kg)  10/12/21 267 lb 11.2 oz (121.4 kg)    Physical Exam Vitals and nursing note reviewed.   Constitutional:      General: He is not in acute distress.    Appearance: Normal appearance. He is obese. He is not ill-appearing, toxic-appearing or diaphoretic.  HENT:     Head: Normocephalic and atraumatic.     Right Ear: Tympanic membrane, ear canal and external ear normal. There is no impacted cerumen.     Left Ear: Tympanic membrane, ear canal and external ear normal. There is no impacted cerumen.     Nose: Nose normal. No congestion or rhinorrhea.  Mouth/Throat:     Mouth: Mucous membranes are moist.     Pharynx: Oropharynx is clear. No oropharyngeal exudate or posterior oropharyngeal erythema.  Eyes:     General: No scleral icterus.       Right eye: No discharge.        Left eye: No discharge.     Extraocular Movements: Extraocular movements intact.     Conjunctiva/sclera: Conjunctivae normal.     Pupils: Pupils are equal, round, and reactive to light.  Neck:     Vascular: No carotid bruit.  Cardiovascular:     Rate and Rhythm: Normal rate and regular rhythm.     Pulses: Normal pulses.     Heart sounds: No murmur heard.    No friction rub. No gallop.  Pulmonary:     Effort: Pulmonary effort is normal. No respiratory distress.     Breath sounds: Normal breath sounds. No stridor. No wheezing, rhonchi or rales.  Chest:     Chest wall: No tenderness.  Abdominal:     General: Abdomen is flat. Bowel sounds are normal. There is no distension.     Palpations: Abdomen is soft. There is no mass.     Tenderness: There is no abdominal tenderness. There is no right CVA tenderness, left CVA tenderness, guarding or rebound.     Hernia: No hernia is present.  Genitourinary:    Comments: Genital exam deferred with shared decision making Musculoskeletal:        General: No swelling, tenderness, deformity or signs of injury.     Cervical back: Normal range of motion and neck supple. No rigidity. No muscular tenderness.     Right lower leg: No edema.     Left lower leg: No edema.   Lymphadenopathy:     Cervical: No cervical adenopathy.  Skin:    General: Skin is warm and dry.     Capillary Refill: Capillary refill takes less than 2 seconds.     Coloration: Skin is not jaundiced or pale.     Findings: No bruising, erythema, lesion or rash.  Neurological:     General: No focal deficit present.     Mental Status: He is alert and oriented to person, place, and time.     Cranial Nerves: No cranial nerve deficit.     Sensory: No sensory deficit.     Motor: No weakness.     Coordination: Coordination normal.     Gait: Gait normal.     Deep Tendon Reflexes: Reflexes normal.  Psychiatric:        Mood and Affect: Mood normal.        Behavior: Behavior normal.        Thought Content: Thought content normal.        Judgment: Judgment normal.     Results for orders placed or performed in visit on 10/26/21  C-reactive protein  Result Value Ref Range   CRP 5.8 <8.0 mg/L  CBC with Differential/Platelet  Result Value Ref Range   WBC 8.7 3.8 - 10.8 Thousand/uL   RBC 4.58 4.20 - 5.80 Million/uL   Hemoglobin 15.2 13.2 - 17.1 g/dL   HCT 43.1 38.5 - 50.0 %   MCV 94.1 80.0 - 100.0 fL   MCH 33.2 (H) 27.0 - 33.0 pg   MCHC 35.3 32.0 - 36.0 g/dL   RDW 12.3 11.0 - 15.0 %   Platelets 216 140 - 400 Thousand/uL   MPV 10.0 7.5 - 12.5 fL   Neutro Abs  5,333 1,500 - 7,800 cells/uL   Lymphs Abs 2,175 850 - 3,900 cells/uL   Absolute Monocytes 783 200 - 950 cells/uL   Eosinophils Absolute 348 15 - 500 cells/uL   Basophils Absolute 61 0 - 200 cells/uL   Neutrophils Relative % 61.3 %   Total Lymphocyte 25.0 %   Monocytes Relative 9.0 %   Eosinophils Relative 4.0 %   Basophils Relative 0.7 %  Sedimentation rate  Result Value Ref Range   Sed Rate 9 0 - 20 mm/h      Assessment & Plan:   Problem List Items Addressed This Visit       Cardiovascular and Mediastinum   Essential hypertension    Under good control on current regimen. Continue current regimen. Continue to  monitor. Call with any concerns. Refills given. Labs drawn today.        Relevant Medications   losartan (COZAAR) 100 MG tablet   Other Relevant Orders   Microalbumin, Urine Waived     Endocrine   Hypothyroidism    Rechecking labs today. Await results. Treat as needed.       Relevant Orders   TSH     Other   Vitamin D deficiency    Rechecking labs today. Await results. Treat as needed.       Relevant Orders   VITAMIN D 25 Hydroxy (Vit-D Deficiency, Fractures)   Other Visit Diagnoses     Routine general medical examination at a health care facility    -  Primary   Vaccines up to date/declined. Screening labs checked today. Colonoscopy up to date. Continue diet and exercise. Call with any concerns.   Relevant Orders   Comprehensive metabolic panel   CBC with Differential/Platelet   Lipid Panel w/o Chol/HDL Ratio   PSA   TSH   Urinalysis, Routine w reflex microscopic        LABORATORY TESTING:  Health maintenance labs ordered today as discussed above.   The natural history of prostate cancer and ongoing controversy regarding screening and potential treatment outcomes of prostate cancer has been discussed with the patient. The meaning of a false positive PSA and a false negative PSA has been discussed. He indicates understanding of the limitations of this screening test and wishes to proceed with screening PSA testing.   IMMUNIZATIONS:   - Tdap: Tetanus vaccination status reviewed: last tetanus booster within 10 years. - Influenza: Refused - Pneumovax: Not applicable - Prevnar: Not applicable - COVID: Refused - HPV: Not applicable - Shingrix vaccine: Refused  SCREENING: - Colonoscopy: Up to date  Discussed with patient purpose of the colonoscopy is to detect colon cancer at curable precancerous or early stages   PATIENT COUNSELING:    Sexuality: Discussed sexually transmitted diseases, partner selection, use of condoms, avoidance of unintended pregnancy  and  contraceptive alternatives.   Advised to avoid cigarette smoking.  I discussed with the patient that most people either abstain from alcohol or drink within safe limits (<=14/week and <=4 drinks/occasion for males, <=7/weeks and <= 3 drinks/occasion for females) and that the risk for alcohol disorders and other health effects rises proportionally with the number of drinks per week and how often a drinker exceeds daily limits.  Discussed cessation/primary prevention of drug use and availability of treatment for abuse.   Diet: Encouraged to adjust caloric intake to maintain  or achieve ideal body weight, to reduce intake of dietary saturated fat and total fat, to limit sodium intake by avoiding high sodium foods and  not adding table salt, and to maintain adequate dietary potassium and calcium preferably from fresh fruits, vegetables, and low-fat dairy products.    stressed the importance of regular exercise  Injury prevention: Discussed safety belts, safety helmets, smoke detector, smoking near bedding or upholstery.   Dental health: Discussed importance of regular tooth brushing, flossing, and dental visits.   Follow up plan: NEXT PREVENTATIVE PHYSICAL DUE IN 1 YEAR. Return in about 6 months (around 10/06/2022).

## 2022-04-05 NOTE — Assessment & Plan Note (Signed)
Under good control on current regimen. Continue current regimen. Continue to monitor. Call with any concerns. Refills given. Labs drawn today.   

## 2022-04-06 LAB — COMPREHENSIVE METABOLIC PANEL
ALT: 68 IU/L — ABNORMAL HIGH (ref 0–44)
AST: 44 IU/L — ABNORMAL HIGH (ref 0–40)
Albumin/Globulin Ratio: 2.2 (ref 1.2–2.2)
Albumin: 4.8 g/dL (ref 3.9–4.9)
Alkaline Phosphatase: 91 IU/L (ref 44–121)
BUN/Creatinine Ratio: 19 (ref 10–24)
BUN: 15 mg/dL (ref 8–27)
Bilirubin Total: 0.6 mg/dL (ref 0.0–1.2)
CO2: 22 mmol/L (ref 20–29)
Calcium: 9.8 mg/dL (ref 8.6–10.2)
Chloride: 101 mmol/L (ref 96–106)
Creatinine, Ser: 0.8 mg/dL (ref 0.76–1.27)
Globulin, Total: 2.2 g/dL (ref 1.5–4.5)
Glucose: 110 mg/dL — ABNORMAL HIGH (ref 70–99)
Potassium: 4.6 mmol/L (ref 3.5–5.2)
Sodium: 140 mmol/L (ref 134–144)
Total Protein: 7 g/dL (ref 6.0–8.5)
eGFR: 99 mL/min/{1.73_m2} (ref >59)

## 2022-04-06 LAB — CBC WITH DIFFERENTIAL/PLATELET
Basophils Absolute: 0 10*3/uL (ref 0.0–0.2)
Basos: 0 %
EOS (ABSOLUTE): 0.3 10*3/uL (ref 0.0–0.4)
Eos: 3 %
Hematocrit: 42.7 % (ref 37.5–51.0)
Hemoglobin: 15 g/dL (ref 13.0–17.7)
Immature Grans (Abs): 0 10*3/uL (ref 0.0–0.1)
Immature Granulocytes: 0 %
Lymphocytes Absolute: 2.6 10*3/uL (ref 0.7–3.1)
Lymphs: 26 %
MCH: 33.2 pg — ABNORMAL HIGH (ref 26.6–33.0)
MCHC: 35.1 g/dL (ref 31.5–35.7)
MCV: 95 fL (ref 79–97)
Monocytes Absolute: 0.8 10*3/uL (ref 0.1–0.9)
Monocytes: 8 %
Neutrophils Absolute: 6.2 10*3/uL (ref 1.4–7.0)
Neutrophils: 63 %
Platelets: 250 10*3/uL (ref 150–450)
RBC: 4.52 x10E6/uL (ref 4.14–5.80)
RDW: 12.6 % (ref 11.6–15.4)
WBC: 9.9 10*3/uL (ref 3.4–10.8)

## 2022-04-06 LAB — LIPID PANEL W/O CHOL/HDL RATIO
Cholesterol, Total: 173 mg/dL (ref 100–199)
HDL: 47 mg/dL (ref >39)
LDL Chol Calc (NIH): 84 mg/dL (ref 0–99)
Triglycerides: 251 mg/dL — ABNORMAL HIGH (ref 0–149)
VLDL Cholesterol Cal: 42 mg/dL — ABNORMAL HIGH (ref 5–40)

## 2022-04-06 LAB — TSH: TSH: 3.2 u[IU]/mL (ref 0.450–4.500)

## 2022-04-06 LAB — VITAMIN D 25 HYDROXY (VIT D DEFICIENCY, FRACTURES): Vit D, 25-Hydroxy: 25.9 ng/mL — ABNORMAL LOW (ref 30.0–100.0)

## 2022-04-06 LAB — PSA: Prostate Specific Ag, Serum: 1.4 ng/mL (ref 0.0–4.0)

## 2022-04-12 ENCOUNTER — Encounter: Payer: Managed Care, Other (non HMO) | Admitting: Family Medicine

## 2022-04-15 ENCOUNTER — Encounter: Payer: Managed Care, Other (non HMO) | Admitting: Family Medicine

## 2022-04-30 ENCOUNTER — Encounter: Payer: Self-pay | Admitting: Family Medicine

## 2022-09-09 ENCOUNTER — Other Ambulatory Visit: Payer: Self-pay | Admitting: Family Medicine

## 2022-09-11 NOTE — Telephone Encounter (Signed)
Requested Prescriptions  Pending Prescriptions Disp Refills   levothyroxine (SYNTHROID) 88 MCG tablet [Pharmacy Med Name: LEVOTHYROXINE 88 MCG TABLET] 90 tablet 0    Sig: TAKE 1 TABLET BY MOUTH EVERY DAY     Endocrinology:  Hypothyroid Agents Passed - 09/09/2022  6:16 PM      Passed - TSH in normal range and within 360 days    TSH  Date Value Ref Range Status  04/05/2022 3.200 0.450 - 4.500 uIU/mL Final         Passed - Valid encounter within last 12 months    Recent Outpatient Visits           5 months ago Routine general medical examination at a health care facility   [facility], Megan P, DO   11 months ago Acute pain of right knee   [place] [facility] [place], Megan P, DO   1 year ago Arthritis   [facility] [place], Megan P, DO   1 year ago Stiffness in joint   [place] [facility] [place], Avanti, MD   1 year ago Essential hypertension   [place] [facility] [place], [place], DO       Future Appointments             In 3 weeks Laural Benes, Oralia Rud, DO  [facility]

## 2022-09-29 ENCOUNTER — Other Ambulatory Visit: Payer: Self-pay | Admitting: Family Medicine

## 2022-10-01 NOTE — Telephone Encounter (Signed)
Requested Prescriptions  Pending Prescriptions Disp Refills   omeprazole (PRILOSEC) 20 MG capsule [Pharmacy Med Name: OMEPRAZOLE DR 20 MG CAPSULE] 90 capsule 0    Sig: TAKE 1 CAPSULE BY MOUTH EVERY DAY     Gastroenterology: Proton Pump Inhibitors Passed - 09/29/2022  8:40 AM      Passed - Valid encounter within last 12 months    Recent Outpatient Visits           5 months ago Routine general medical examination at a health care facility   [facility], Megan P, DO   11 months ago Acute pain of right knee   [place] [facility] [place], Oralia Rud, DO   1 year ago Arthritis   [facility] [place], Megan P, DO   1 year ago Stiffness in joint   [place] [facility] Vigg, Avanti, MD   1 year ago Essential hypertension   [place] [facility] [place], Oralia Rud, DO       Future Appointments             In 3 days Dorcas Carrow, DO [place] [facility]

## 2022-10-04 ENCOUNTER — Encounter: Payer: Self-pay | Admitting: Family Medicine

## 2022-10-04 ENCOUNTER — Ambulatory Visit (INDEPENDENT_AMBULATORY_CARE_PROVIDER_SITE_OTHER): Payer: Managed Care, Other (non HMO) | Admitting: Family Medicine

## 2022-10-04 VITALS — BP 135/78 | HR 62 | Temp 97.7°F | Wt 269.8 lb

## 2022-10-04 DIAGNOSIS — R0789 Other chest pain: Secondary | ICD-10-CM | POA: Diagnosis not present

## 2022-10-04 DIAGNOSIS — E559 Vitamin D deficiency, unspecified: Secondary | ICD-10-CM

## 2022-10-04 DIAGNOSIS — I10 Essential (primary) hypertension: Secondary | ICD-10-CM

## 2022-10-04 DIAGNOSIS — E039 Hypothyroidism, unspecified: Secondary | ICD-10-CM | POA: Diagnosis not present

## 2022-10-04 NOTE — Assessment & Plan Note (Signed)
Under good control on current regimen. Continue current regimen. Continue to monitor. Call with any concerns. Refills given. Labs drawn today.   

## 2022-10-04 NOTE — Assessment & Plan Note (Signed)
Rechecking labs today. Await results. Treat as needed.  °

## 2022-10-04 NOTE — Progress Notes (Signed)
BP 135/78   Pulse 62   Temp 97.7 F (36.5 C) (Oral)   Wt 269 lb 12.8 oz (122.4 kg)   SpO2 97%   BMI 35.11 kg/m    Subjective:    Patient ID: Nicholas Wall, male    DOB: April 28, 1955, 67 y.o.   MRN: [id number]  HPI: Nicholas Wall is a 67 y.o. male  Chief Complaint  Patient presents with   Hypothyroidism   HYPERTENSION  Hypertension status: controlled  Satisfied with current treatment? yes Duration of hypertension: chronic BP monitoring frequency:  not checking BP medication side effects:  no Medication compliance: excellent compliance Previous BP meds: losartan Aspirin: no Recurrent headaches: no Visual changes: no Palpitations: no Dyspnea: no Chest pain: no Lower extremity edema: no Dizzy/lightheaded: no  HYPOTHYROIDISM Thyroid control status:stable Satisfied with current treatment? yes Medication side effects: no Medication compliance: excellent compliance Etiology of hypothyroidism:  Recent dose adjustment:no Fatigue: no Cold intolerance: no Heat intolerance: no Weight gain: no Weight loss: no Constipation: no Diarrhea/loose stools: no Palpitations: no Lower extremity edema: no Anxiety/depressed mood: no  CHEST/THROAT TIGHTNESS Time since onset: about 2 weeks ago Onset: sudden Quality: tightness in his throat that went into his neck and into his L shoulder Severity: moderate Episode duration: about an hour Frequency: once Related to exertion: no Activity when pain started: laying down Trauma: no Anxiety/recent stressors: no Status: hasn't happened again Treatments attempted: nothing  Current pain status: pain free Shortness of breath: no Cough: no Nausea: no Diaphoresis: no Heartburn: no Palpitations: no   Relevant past medical, surgical, family and social history reviewed and updated as indicated. Interim medical history since our last visit reviewed. Allergies and medications reviewed and updated.  Review of Systems   Constitutional: Negative.   Respiratory: Negative.    Cardiovascular: Negative.   Gastrointestinal: Negative.   Musculoskeletal: Negative.   Psychiatric/Behavioral: Negative.      Per HPI unless specifically indicated above     Objective:    BP 135/78   Pulse 62   Temp 97.7 F (36.5 C) (Oral)   Wt 269 lb 12.8 oz (122.4 kg)   SpO2 97%   BMI 35.11 kg/m   Wt Readings from Last 3 Encounters:  10/04/22 269 lb 12.8 oz (122.4 kg)  04/05/22 275 lb 8 oz (125 kg)  10/26/21 269 lb 12.8 oz (122.4 kg)    Physical Exam Vitals and nursing note reviewed.  Constitutional:      General: He is not in acute distress.    Appearance: Normal appearance. He is obese. He is not ill-appearing, toxic-appearing or diaphoretic.  HENT:     Head: Normocephalic and atraumatic.     Right Ear: External ear normal.     Left Ear: External ear normal.     Nose: Nose normal.     Mouth/Throat:     Mouth: Mucous membranes are moist.     Pharynx: Oropharynx is clear.  Eyes:     General: No scleral icterus.       Right eye: No discharge.        Left eye: No discharge.     Extraocular Movements: Extraocular movements intact.     Conjunctiva/sclera: Conjunctivae normal.     Pupils: Pupils are equal, round, and reactive to light.  Cardiovascular:     Rate and Rhythm: Normal rate and regular rhythm.     Pulses: Normal pulses.     Heart sounds: Normal heart sounds. No murmur heard.    No friction  rub. No gallop.  Pulmonary:     Effort: Pulmonary effort is normal. No respiratory distress.     Breath sounds: Normal breath sounds. No stridor. No wheezing, rhonchi or rales.  Chest:     Chest wall: No tenderness.  Musculoskeletal:        General: Normal range of motion.     Cervical back: Normal range of motion and neck supple.  Skin:    General: Skin is warm and dry.     Capillary Refill: Capillary refill takes less than 2 seconds.     Coloration: Skin is not jaundiced or pale.     Findings: No  bruising, erythema, lesion or rash.  Neurological:     General: No focal deficit present.     Mental Status: He is alert and oriented to person, place, and time. Mental status is at baseline.  Psychiatric:        Mood and Affect: Mood normal.        Behavior: Behavior normal.        Thought Content: Thought content normal.        Judgment: Judgment normal.     Results for orders placed or performed in visit on 04/05/22  Comprehensive metabolic panel  Result Value Ref Range   Glucose 110 (H) 70 - 99 mg/dL   BUN 15 8 - 27 mg/dL   Creatinine, Ser 4.09 0.76 - 1.27 mg/dL   eGFR 99 >81 XB/JYN/8.29   BUN/Creatinine Ratio 19 10 - 24   Sodium 140 134 - 144 mmol/L   Potassium 4.6 3.5 - 5.2 mmol/L   Chloride 101 96 - 106 mmol/L   CO2 22 20 - 29 mmol/L   Calcium 9.8 8.6 - 10.2 mg/dL   Total Protein 7.0 6.0 - 8.5 g/dL   Albumin 4.8 3.9 - 4.9 g/dL   Globulin, Total 2.2 1.5 - 4.5 g/dL   Albumin/Globulin Ratio 2.2 1.2 - 2.2   Bilirubin Total 0.6 0.0 - 1.2 mg/dL   Alkaline Phosphatase 91 44 - 121 IU/L   AST 44 (H) 0 - 40 IU/L   ALT 68 (H) 0 - 44 IU/L  CBC with Differential/Platelet  Result Value Ref Range   WBC 9.9 3.4 - 10.8 x10E3/uL   RBC 4.52 4.14 - 5.80 x10E6/uL   Hemoglobin 15.0 13.0 - 17.7 g/dL   Hematocrit 56.2 13.0 - 51.0 %   MCV 95 79 - 97 fL   MCH 33.2 (H) 26.6 - 33.0 pg   MCHC 35.1 31.5 - 35.7 g/dL   RDW 86.5 78.4 - 69.6 %   Platelets 250 150 - 450 x10E3/uL   Neutrophils 63 Not Estab. %   Lymphs 26 Not Estab. %   Monocytes 8 Not Estab. %   Eos 3 Not Estab. %   Basos 0 Not Estab. %   Neutrophils Absolute 6.2 1.4 - 7.0 x10E3/uL   Lymphocytes Absolute 2.6 0.7 - 3.1 x10E3/uL   Monocytes Absolute 0.8 0.1 - 0.9 x10E3/uL   EOS (ABSOLUTE) 0.3 0.0 - 0.4 x10E3/uL   Basophils Absolute 0.0 0.0 - 0.2 x10E3/uL   Immature Granulocytes 0 Not Estab. %   Immature Grans (Abs) 0.0 0.0 - 0.1 x10E3/uL  Lipid Panel w/o Chol/HDL Ratio  Result Value Ref Range   Cholesterol, Total 173 100 -  199 mg/dL   Triglycerides 295 (H) 0 - 149 mg/dL   HDL 47 >28 mg/dL   VLDL Cholesterol Cal 42 (H) 5 - 40 mg/dL   LDL Chol Calc (  NIH) 84 0 - 99 mg/dL  PSA  Result Value Ref Range   Prostate Specific Ag, Serum 1.4 0.0 - 4.0 ng/mL  TSH  Result Value Ref Range   TSH 3.200 0.450 - 4.500 uIU/mL  Urinalysis, Routine w reflex microscopic  Result Value Ref Range   Specific Gravity, UA 1.015 1.005 - 1.030   pH, UA 5.5 5.0 - 7.5   Color, UA Yellow Yellow   Appearance Ur Clear Clear   Leukocytes,UA Negative Negative   Protein,UA Negative Negative/Trace   Glucose, UA Negative Negative   Ketones, UA Negative Negative   RBC, UA Negative Negative   Bilirubin, UA Negative Negative   Urobilinogen, Ur 0.2 0.2 - 1.0 mg/dL   Nitrite, UA Negative Negative   Microscopic Examination Comment   Microalbumin, Urine Waived  Result Value Ref Range   Microalb, Ur Waived 10 0 - 19 mg/L   Creatinine, Urine Waived 100 10 - 300 mg/dL   Microalb/Creat Ratio <30 <30 mg/g  VITAMIN D 25 Hydroxy (Vit-D Deficiency, Fractures)  Result Value Ref Range   Vit D, 25-Hydroxy 25.9 (L) 30.0 - 100.0 ng/mL      Assessment & Plan:   Problem List Items Addressed This Visit       Cardiovascular and Mediastinum   Essential hypertension    Under good control on current regimen. Continue current regimen. Continue to monitor. Call with any concerns. Refills given. Labs drawn today.        Relevant Orders   Basic metabolic panel     Endocrine   Hypothyroidism - Primary    Rechecking labs today. Await results. Treat as needed.       Relevant Orders   TSH     Other   Vitamin D deficiency    Rechecking labs today. Await results. Treat as needed.       Relevant Orders   VITAMIN D 25 Hydroxy (Vit-D Deficiency, Fractures)   Other Visit Diagnoses     Chest tightness       Likely GERD- EKG shows PVCs, but otherwise normal. Call if it happens again. Continue to monitor.   Relevant Orders   EKG 12-Lead (Completed)         Follow up plan: Return in about 6 months (around 04/03/2023) for physical.

## 2022-10-05 LAB — BASIC METABOLIC PANEL
BUN/Creatinine Ratio: 21 (ref 10–24)
BUN: 18 mg/dL (ref 8–27)
CO2: 23 mmol/L (ref 20–29)
Calcium: 8.9 mg/dL (ref 8.6–10.2)
Chloride: 104 mmol/L (ref 96–106)
Creatinine, Ser: 0.87 mg/dL (ref 0.76–1.27)
Glucose: 144 mg/dL — ABNORMAL HIGH (ref 70–99)
Potassium: 4.4 mmol/L (ref 3.5–5.2)
Sodium: 141 mmol/L (ref 134–144)
eGFR: 96 mL/min/{1.73_m2} (ref >59)

## 2022-10-05 LAB — VITAMIN D 25 HYDROXY (VIT D DEFICIENCY, FRACTURES): Vit D, 25-Hydroxy: 30.4 ng/mL (ref 30.0–100.0)

## 2022-10-05 LAB — TSH: TSH: 2.75 u[IU]/mL (ref 0.450–4.500)

## 2022-10-13 NOTE — Progress Notes (Signed)
NSR at 63bpm with PVCs. No ST segment changes.

## 2023-01-01 ENCOUNTER — Other Ambulatory Visit: Payer: Self-pay | Admitting: Family Medicine

## 2023-01-01 NOTE — Telephone Encounter (Signed)
Requested Prescriptions  Pending Prescriptions Disp Refills   losartan (COZAAR) 100 MG tablet [Pharmacy Med Name: LOSARTAN POTASSIUM 100 MG TAB] 90 tablet 1    Sig: TAKE 1 TABLET BY MOUTH EVERY DAY     Cardiovascular:  Angiotensin Receptor Blockers Passed - 01/01/2023  2:21 AM      Passed - Cr in normal range and within 180 days    Creat  Date Value Ref Range Status  06/01/2021 0.92 0.70 - 1.35 mg/dL Final   Creatinine, Ser  Date Value Ref Range Status  10/04/2022 0.87 0.76 - 1.27 mg/dL Final         Passed - K in normal range and within 180 days    Potassium  Date Value Ref Range Status  10/04/2022 4.4 3.5 - 5.2 mmol/L Final         Passed - Patient is not pregnant      Passed - Last BP in normal range    BP Readings from Last 1 Encounters:  10/04/22 135/78         Passed - Valid encounter within last 6 months    Recent Outpatient Visits           2 months ago Hypothyroidism, unspecified type   [place] [facility] [place], Megan P, DO   9 months ago Routine general medical examination at a health care facility   [facility] [place], [place] P, DO   1 year ago Acute pain of right knee   [place] [facility] [place], Megan P, DO   1 year ago Arthritis   [place] [facility] [place], Megan P, DO   1 year ago Stiffness in joint   [place] [facility] V[place], Avanti, MD       Future Appointments             In 3 months Laural Benes, Oralia Rud, DO [place] [facility]             omeprazole (PRILOSEC) 20 MG capsule [Pharmacy Med Name: OMEPRAZOLE DR 20 MG CAPSULE] 90 capsule 0    Sig: TAKE 1 CAPSULE BY MOUTH EVERY DAY     Gastroenterology: Proton Pump Inhibitors Passed - 01/01/2023  2:21 AM      Passed - Valid encounter within last 12 months    Recent Outpatient Visits           2 months ago Hypothyroidism, unspecified type   [place] [facility] [place], Megan P, DO   9 months ago Routine general medical examination at a health care facility   [place] [facility] [place], [place] P, DO   1 year ago Acute pain of right knee   [place] [facility] [place], Megan P, DO   1 year ago Arthritis   [place] [facility] [place], Megan P, DO   1 year ago Stiffness in joint   [place] [facility] Loura Pardon, MD       Future Appointments             In 3 months Laural Benes, Oralia Rud, DO [place] [facility]

## 2023-02-23 ENCOUNTER — Other Ambulatory Visit: Payer: Self-pay | Admitting: Family Medicine

## 2023-02-24 ENCOUNTER — Ambulatory Visit (INDEPENDENT_AMBULATORY_CARE_PROVIDER_SITE_OTHER): Payer: Managed Care, Other (non HMO) | Admitting: Dermatology

## 2023-02-24 DIAGNOSIS — W908XXA Exposure to other nonionizing radiation, initial encounter: Secondary | ICD-10-CM | POA: Diagnosis not present

## 2023-02-24 DIAGNOSIS — L570 Actinic keratosis: Secondary | ICD-10-CM

## 2023-02-24 DIAGNOSIS — B351 Tinea unguium: Secondary | ICD-10-CM | POA: Diagnosis not present

## 2023-02-24 DIAGNOSIS — L814 Other melanin hyperpigmentation: Secondary | ICD-10-CM

## 2023-02-24 DIAGNOSIS — D229 Melanocytic nevi, unspecified: Secondary | ICD-10-CM

## 2023-02-24 DIAGNOSIS — L578 Other skin changes due to chronic exposure to nonionizing radiation: Secondary | ICD-10-CM | POA: Diagnosis not present

## 2023-02-24 DIAGNOSIS — B353 Tinea pedis: Secondary | ICD-10-CM | POA: Diagnosis not present

## 2023-02-24 DIAGNOSIS — Z1283 Encounter for screening for malignant neoplasm of skin: Secondary | ICD-10-CM

## 2023-02-24 DIAGNOSIS — D2272 Melanocytic nevi of left lower limb, including hip: Secondary | ICD-10-CM

## 2023-02-24 DIAGNOSIS — L821 Other seborrheic keratosis: Secondary | ICD-10-CM

## 2023-02-24 DIAGNOSIS — I781 Nevus, non-neoplastic: Secondary | ICD-10-CM

## 2023-02-24 DIAGNOSIS — D1801 Hemangioma of skin and subcutaneous tissue: Secondary | ICD-10-CM

## 2023-02-24 DIAGNOSIS — L918 Other hypertrophic disorders of the skin: Secondary | ICD-10-CM

## 2023-02-24 MED ORDER — CICLOPIROX OLAMINE 0.77 % EX CREA
TOPICAL_CREAM | CUTANEOUS | 11 refills | Status: AC
Start: 2023-02-24 — End: ?

## 2023-02-24 MED ORDER — FLUCONAZOLE 200 MG PO TABS
200.0000 mg | ORAL_TABLET | Freq: Every day | ORAL | 0 refills | Status: DC
Start: 2023-02-24 — End: 2023-07-31

## 2023-02-24 NOTE — Patient Instructions (Addendum)
For toes and feet Apply topically ciclopirox 0.77 % cream - apply topically to feet and toes twice daily for 2 - 4 week then continue daily for another week   Start Fluconazole 200 mg tab - take 1 pill by mouth weekly   Side effects of fluconazole (diflucan) include nausea, diarrhea, headache, dizziness, taste changes, rare risk of irritation of the liver, allergy, or decreased blood counts (which could show up as infection or tiredness).   Recommend over the counter AmLactin 12% lotion/cream (Lactic Acid).    Recommend OTC Zeasorb AF powder to socks.  It is often found in the athlete's foot section in the pharmacy.  Avoid using powders that contain cornstarch.    Actinic keratoses are precancerous spots that appear secondary to cumulative UV radiation exposure/sun exposure over time. They are chronic with expected duration over 1 year. A portion of actinic keratoses will progress to squamous cell carcinoma of the skin. It is not possible to reliably predict which spots will progress to skin cancer and so treatment is recommended to prevent development of skin cancer.  Recommend daily broad spectrum sunscreen SPF 30+ to sun-exposed areas, reapply every 2 hours as needed.  Recommend staying in the shade or wearing long sleeves, sun glasses (UVA+UVB protection) and wide brim hats (4-inch brim around the entire circumference of the hat). Call for new or changing lesions.    Cryotherapy Aftercare  Wash gently with soap and water everyday.   Apply Vaseline and Band-Aid daily until healed.     Seborrheic Keratosis  What causes seborrheic keratoses? Seborrheic keratoses are harmless, common skin growths that first appear during adult life.  As time goes by, more growths appear.  Some people may develop a large number of them.  Seborrheic keratoses appear on both covered and uncovered body parts.  They are not caused by sunlight.  The tendency to develop seborrheic keratoses can be inherited.   They vary in color from skin-colored to gray, brown, or even black.  They can be either smooth or have a rough, warty surface.   Seborrheic keratoses are superficial and look as if they were stuck on the skin.  Under the microscope this type of keratosis looks like layers upon layers of skin.  That is why at times the top layer may seem to fall off, but the rest of the growth remains and re-grows.    Treatment Seborrheic keratoses do not need to be treated, but can easily be removed in the office.  Seborrheic keratoses often cause symptoms when they rub on clothing or jewelry.  Lesions can be in the way of shaving.  If they become inflamed, they can cause itching, soreness, or burning.  Removal of a seborrheic keratosis can be accomplished by freezing, burning, or surgery. If any spot bleeds, scabs, or grows rapidly, please return to have it checked, as these can be an indication of a skin cancer.    Melanoma ABCDEs  Melanoma is the most dangerous type of skin cancer, and is the leading cause of death from skin disease.  You are more likely to develop melanoma if you: Have light-colored skin, light-colored eyes, or red or blond hair Spend a lot of time in the sun Tan regularly, either outdoors or in a tanning bed Have had blistering sunburns, especially during childhood Have a close family member who has had a melanoma Have atypical moles or large birthmarks  Early detection of melanoma is key since treatment is typically straightforward and cure rates  are extremely high if we catch it early.   The first sign of melanoma is often a change in a mole or a new dark spot.  The ABCDE system is a way of remembering the signs of melanoma.  A for asymmetry:  The two halves do not match. B for border:  The edges of the growth are irregular. C for color:  A mixture of colors are present instead of an even brown color. D for diameter:  Melanomas are usually (but not always) greater than 6mm - the  size of a pencil eraser. E for evolution:  The spot keeps changing in size, shape, and color.  Please check your skin once per month between visits. You can use a small mirror in front and a large mirror behind you to keep an eye on the back side or your body.   If you see any new or changing lesions before your next follow-up, please call to schedule a visit.  Please continue daily skin protection including broad spectrum sunscreen SPF 30+ to sun-exposed areas, reapplying every 2 hours as needed when you're outdoors.   Staying in the shade or wearing long sleeves, sun glasses (UVA+UVB protection) and wide brim hats (4-inch brim around the entire circumference of the hat) are also recommended for sun protection.    Due to recent changes in healthcare laws, you may see results of your pathology and/or laboratory studies on MyChart before the doctors have had a chance to review them. We understand that in some cases there may be results that are confusing or concerning to you. Please understand that not all results are received at the same time and often the doctors may need to interpret multiple results in order to provide you with the best plan of care or course of treatment. Therefore, we ask that you please give [place] 2 business days to thoroughly review all your results before contacting the office for clarification. Should we see a critical lab result, you will be contacted sooner.   If You Need Anything After Your Visit  If you have any questions or concerns for your doctor, please call our main line at [phone] and press option 4 to reach your doctor's medical assistant. If no one answers, please leave a voicemail as directed and we will return your call as soon as possible. Messages left after 4 pm will be answered the following business day.   You may also send [place] a message via MyChart. We typically respond to MyChart messages within 1-2 business days.  For prescription refills, please  ask your pharmacy to contact our office. Our fax number is [phone].  If you have an urgent issue when the clinic is closed that cannot wait until the next business day, you can page your doctor at the number below.    Please note that while we do our best to be available for urgent issues outside of office hours, we are not available 24/7.   If you have an urgent issue and are unable to reach [place], you may choose to seek medical care at your doctor's office, retail clinic, urgent care center, or emergency room.  If you have a medical emergency, please immediately call 911 or go to the emergency department.  Pager Numbers  - Dr. Gwen Pounds: [phone]  - Dr. Roseanne Reno: [phone]  - Dr. Katrinka Blazing: [phone]   In the event of inclement weather, please call our main line at [phone] for an update on the status of any delays  or closures.  Dermatology Medication Tips: Please keep the boxes that topical medications come in in order to help keep track of the instructions about where and how to use these. Pharmacies typically print the medication instructions only on the boxes and not directly on the medication tubes.   If your medication is too expensive, please contact our office at [phone] option 4 or send [place] a message through MyChart.   We are unable to tell what your co-pay for medications will be in advance as this is different depending on your insurance coverage. However, we may be able to find a substitute medication at lower cost or fill out paperwork to get insurance to cover a needed medication.   If a prior authorization is required to get your medication covered by your insurance company, please allow [place] 1-2 business days to complete this process.  Drug prices often vary depending on where the prescription is filled and some pharmacies may offer cheaper prices.  The website www.goodrx.com contains coupons for medications through different pharmacies. The prices here do  not account for what the cost may be with help from insurance (it may be cheaper with your insurance), but the website can give you the price if you did not use any insurance.  - You can print the associated coupon and take it with your prescription to the pharmacy.  - You may also stop by our office during regular business hours and pick up a GoodRx coupon card.  - If you need your prescription sent electronically to a different pharmacy, notify our office through [facility] or by phone at [phone] option 4.     Si Usted Necesita Algo Despus de Su Visita  Tambin puede enviarnos un mensaje a travs de Clinical cytogeneticist. Por lo general respondemos a los mensajes de MyChart en el transcurso de 1 a 2 das hbiles.  Para renovar recetas, por favor pida a su farmacia que se ponga en contacto con nuestra oficina. Annie Sable de fax es [place] [phone].  Si tiene un asunto urgente cuando la clnica est cerrada y que no puede esperar hasta el siguiente da hbil, puede llamar/localizar a su doctor(a) al nmero que aparece a continuacin.   Por favor, tenga en cuenta que aunque hacemos todo lo posible para estar disponibles para asuntos urgentes fuera del horario de [place], no estamos disponibles las 24 horas del da, los 7 [address]  [address] de la [place].   Si tiene un problema urgente y no puede comunicarse con nosotros, puede optar por buscar atencin mdica  en el consultorio de su doctor(a), en una clnica privada, en un centro de atencin urgente o en una sala de emergencias.  Si tiene Engineer, drilling, por favor llame inmediatamente al 911 o vaya a la sala de emergencias.  Nmeros de bper  - Dr. Gwen Pounds: [phone]  - Dra. Roseanne Reno: [phone]  - Dr. Katrinka Blazing: [phone]   En caso de inclemencias del tiempo, por favor llame a Lacy Duverney principal al [phone] para una actualizacin sobre el [place] de cualquier retraso o cierre.  Consejos para la medicacin en  dermatologa: Por favor, guarde las cajas en las que vienen los medicamentos de uso tpico para ayudarle a seguir las instrucciones sobre dnde y cmo usarlos. Las farmacias generalmente imprimen las instrucciones del medicamento slo en las cajas y no directamente en los tubos del [place].   Si su medicamento es muy caro, por favor, pngase en contacto con Rolm Gala llamando al [phone] y presione la opcin  4 o envenos un mensaje a travs de MyChart.   No podemos decirle cul ser su copago por los medicamentos por adelantado ya que esto es diferente dependiendo de la cobertura de su seguro. Sin embargo, es posible que podamos encontrar un medicamento sustituto a Audiological scientist un formulario para que el seguro cubra el medicamento que se considera necesario.   Si se requiere una autorizacin previa para que su compaa de seguros [place] su medicamento, por favor permtanos de 1 a 2 das hbiles para completar [address]  Los precios de los medicamentos varan con frecuencia dependiendo del Environmental consultant de dnde se surte la receta y alguna farmacias pueden ofrecer precios ms baratos.  El sitio web www.goodrx.com tiene cupones para medicamentos de Health and safety inspector. Los precios aqu no tienen en cuenta lo que podra costar con la ayuda del seguro (puede ser ms barato con su seguro), pero el sitio web puede darle el precio si no utiliz Tourist information centre manager.  - Puede imprimir el cupn correspondiente y llevarlo con su receta a la farmacia.  - Tambin puede pasar por nuestra oficina durante el horario de atencin regular y Education officer, museum una tarjeta de cupones de GoodRx.  - Si necesita que su receta se enve electrnicamente a una farmacia diferente, informe a nuestra oficina a travs de MyChart de [place] o por telfono llamando al [phone] y presione la opcin 4.

## 2023-02-24 NOTE — Progress Notes (Signed)
New Patient Visit   Subjective  Nicholas Wall is a 68 y.o. male who presents for the following: Skin Cancer Screening and Full Body Skin Exam Patient denies family or personal history of skin cancer  Patient reports some dark areas at face, spot at neck, spot at left chest, spot at posterior left calf and spot at bottom of left foot he would like checked. Also has thickened toenails.   The patient presents for Total-Body Skin Exam (TBSE) for skin cancer screening and mole check. The patient has spots, moles and lesions to be evaluated, some may be new or changing and the patient may have concern these could be cancer.    The following portions of the chart were reviewed this encounter and updated as appropriate: medications, allergies, medical history  Review of Systems:  No other skin or systemic complaints except as noted in HPI or Assessment and Plan.  Objective  Well appearing patient in no apparent distress; mood and affect are within normal limits.  A full examination was performed including scalp, head, eyes, ears, nose, lips, neck, chest, axillae, abdomen, back, buttocks, bilateral upper extremities, bilateral lower extremities, hands, feet, fingers, toes, fingernails, and toenails. All findings within normal limits unless otherwise noted below.   Relevant physical exam findings are noted in the Assessment and Plan.            left lateral cheek x 6, left lateral forehead x 2 (8) Erythematous thin papules/macules with gritty scale.   Assessment & Plan   SKIN CANCER SCREENING PERFORMED TODAY.  ACTINIC DAMAGE - Chronic condition, secondary to cumulative UV/sun exposure - diffuse scaly erythematous macules with underlying dyspigmentation - Recommend daily broad spectrum sunscreen SPF 30+ to sun-exposed areas, reapply every 2 hours as needed.  - Staying in the shade or wearing long sleeves, sun glasses (UVA+UVB protection) and wide brim hats (4-inch brim around  the entire circumference of the hat) are also recommended for sun protection.  - Call for new or changing lesions.  LENTIGINES, SEBORRHEIC KERATOSES, HEMANGIOMAS - Benign normal skin lesions - Sk -  at sternal notch and left calf  - Hemangioma - left breast  - Benign-appearing - Call for any changes   MELANOCYTIC NEVI - Tan-brown and/or pink-flesh-colored symmetric macules and papules - 2 mm medium brown macule at left plantar foot - Benign appearing on exam today - Observation - Call clinic for new or changing moles - Recommend daily use of broad spectrum spf 30+ sunscreen to sun-exposed areas.    Acrochordons (Skin Tags) Left axilla - Fleshy, skin-colored pedunculated papules - Benign appearing.  - Observe. - If desired, they can be removed with an in office procedure that is not covered by insurance. - Please call the clinic if you notice any new or changing lesions.   TELANGIECTASIA At left malar cheek Exam: dilated blood vessel(s)  Treatment Plan: Benign appearing on exam Call for changes  ONYCHOMYCOSIS and TINEA PEDIS  Exam: Thickened toenails with subungal debris c/w onychomycosis B/l feet toenails and scaling at plantar feet and toes See photos   Chronic and persistent condition with duration or expected duration over one year. Condition is bothersome/symptomatic (painful) for patient. Currently flared.   Treatment Plan: Podiatrist did not recommend starting terbinafine, due to mildly elevated LFTs Patient admits he drinks alcohol daily   3/24-  Lfts slightly elevated  Start Ciclopirox cream - apply topically to feet and toes twice daily for 2-4 weeks, then nightly to toes as preventative  Start Fluconazole 200 mg tab - take 1 tab by mouth weekly. #30 pills  Side effects of fluconazole (diflucan) include nausea, diarrhea, headache, dizziness, taste changes, rare risk of irritation of the liver, allergy, or decreased blood counts (which could show up as  infection or tiredness). will recheck LFTS at next follow up  Recommend  OTC Zeasorb AF powder to feet daily .  It is often found in the athlete's foot section in the pharmacy.  Avoid using powders that contain cornstarch.   Recommend over the counter AmLactin 12% lotion/cream (Lactic Acid) every day/bid.    TINEA UNGUIUM   Related Medications fluconazole (DIFLUCAN) 200 MG tablet Take 1 tablet (200 mg total) by mouth daily. ciclopirox (LOPROX) 0.77 % cream Apply topically to feet and toes bid ACTINIC KERATOSIS (8) left lateral cheek x 6, left lateral forehead x 2 (8) Actinic keratoses are precancerous spots that appear secondary to cumulative UV radiation exposure/sun exposure over time. They are chronic with expected duration over 1 year. A portion of actinic keratoses will progress to squamous cell carcinoma of the skin. It is not possible to reliably predict which spots will progress to skin cancer and so treatment is recommended to prevent development of skin cancer.  Recommend daily broad spectrum sunscreen SPF 30+ to sun-exposed areas, reapply every 2 hours as needed.  Recommend staying in the shade or wearing long sleeves, sun glasses (UVA+UVB protection) and wide brim hats (4-inch brim around the entire circumference of the hat). Call for new or changing lesions. Destruction of lesion - left lateral cheek x 6, left lateral forehead x 2 (8)  Destruction method: cryotherapy   Informed consent: discussed and consent obtained   Lesion destroyed using liquid nitrogen: Yes   Region frozen until ice ball extended beyond lesion: Yes   Outcome: patient tolerated procedure well with no complications   Post-procedure details: wound care instructions given   Additional details:  Prior to procedure, discussed risks of blister formation, small wound, skin dyspigmentation, or rare scar following cryotherapy. Recommend Vaseline ointment to treated areas while healing.   Return for 3 month  recheck feet and aks, 1 year tbse.  I, Asher Muir, CMA, am acting as scribe for Willeen Niece, MD.   Documentation: I have reviewed the above documentation for accuracy and completeness, and I agree with the above.  Willeen Niece, MD

## 2023-02-25 NOTE — Telephone Encounter (Signed)
 Requested Prescriptions  Pending Prescriptions Disp Refills   levothyroxine  (SYNTHROID ) 88 MCG tablet [Pharmacy Med Name: LEVOTHYROXINE  88 MCG TABLET] 90 tablet 0    Sig: TAKE 1 TABLET BY MOUTH EVERY DAY     Endocrinology:  Hypothyroid Agents Passed - 02/25/2023  9:04 AM      Passed - TSH in normal range and within 360 days    TSH  Date Value Ref Range Status  10/04/2022 2.750 0.450 - 4.500 uIU/mL Final         Passed - Valid encounter within last 12 months    Recent Outpatient Visits           4 months ago Hypothyroidism, unspecified type   [place] [facility] [place], Megan P, DO   10 months ago Routine general medical examination at a health care facility   [place] [facility] [place], [place] P, DO   1 year ago Acute pain of right knee   [place] [facility] [place], Duwaine SQUIBB, DO   1 year ago Arthritis   [place] [facility], Megan P, DO   1 year ago Stiffness in joint   [place] [facility] Maryjane Public, MD       Future Appointments             In 1 month [place], Duwaine SQUIBB, DO [facility]   In 3 months Jackquline Sawyer, MD [facility]   In 1 year Jackquline Sawyer, MD [facility]

## 2023-04-03 ENCOUNTER — Other Ambulatory Visit: Payer: Self-pay | Admitting: Family Medicine

## 2023-04-03 NOTE — Telephone Encounter (Signed)
 Requested Prescriptions  Pending Prescriptions Disp Refills   omeprazole (PRILOSEC) 20 MG capsule [Pharmacy Med Name: OMEPRAZOLE DR 20 MG CAPSULE] 90 capsule 0    Sig: TAKE 1 CAPSULE BY MOUTH EVERY DAY     Gastroenterology: Proton Pump Inhibitors Passed - 04/03/2023  1:15 PM      Passed - Valid encounter within last 12 months    Recent Outpatient Visits           6 months ago Hypothyroidism, unspecified type   [facility] [place], Megan P, DO   12 months ago Routine general medical examination at a health care facility   [facility] [place], [place] P, DO   1 year ago Acute pain of right knee   [facility] [place], Oralia Rud, DO   1 year ago Arthritis   [facility] [place], Megan P, DO   2 years ago Stiffness in joint   [facility] [place], Avanti, MD       Future Appointments             Tomorrow Dorcas Carrow, DO [facility]   In 2 months Willeen Niece, MD [facility]   In 11 months Willeen Niece, MD [facility]             losartan (COZAAR) 100 MG tablet [Pharmacy Med Name: LOSARTAN POTASSIUM 100 MG TAB] 90 tablet 0    Sig: TAKE 1 TABLET BY MOUTH EVERY DAY     Cardiovascular:  Angiotensin Receptor Blockers Failed - 04/03/2023  1:15 PM      Failed - Cr in normal range and within 180 days    Creat  Date Value Ref Range Status  06/01/2021 0.92 0.70 - 1.35 mg/dL Final   Creatinine, Ser  Date Value Ref Range Status  10/04/2022 0.87 0.76 - 1.27 mg/dL Final         Failed - K in normal range and within 180 days    Potassium  Date Value Ref Range Status  10/04/2022 4.4 3.5 - 5.2 mmol/L Final         Failed - Valid encounter within last 6 months    Recent Outpatient Visits           6 months ago Hypothyroidism, unspecified type   [facility]  [place], Megan P, DO   12 months ago Routine general medical examination at a health care facility   [facility] [place], [place] P, DO   1 year ago Acute pain of right knee   [facility] [place], Megan P, DO   1 year ago Arthritis   [facility] [place], Megan P, DO   2 years ago Stiffness in joint   [facility] Vigg, Avanti, MD       Future Appointments             Tomorrow Dorcas Carrow, DO [facility]   In 2 months Willeen Niece, MD [facility]   In 11 months Willeen Niece, MD  [facility]            Passed - Patient is not pregnant      Passed - Last BP in normal range    BP Readings from Last 1  Encounters:  10/04/22 135/78

## 2023-04-04 ENCOUNTER — Ambulatory Visit (INDEPENDENT_AMBULATORY_CARE_PROVIDER_SITE_OTHER): Payer: Managed Care, Other (non HMO) | Admitting: Family Medicine

## 2023-04-04 VITALS — BP 122/82 | HR 62 | Temp 98.4°F | Resp 17 | Ht 73.35 in | Wt 269.4 lb

## 2023-04-04 DIAGNOSIS — M542 Cervicalgia: Secondary | ICD-10-CM | POA: Diagnosis not present

## 2023-04-04 DIAGNOSIS — K219 Gastro-esophageal reflux disease without esophagitis: Secondary | ICD-10-CM | POA: Diagnosis not present

## 2023-04-04 DIAGNOSIS — I10 Essential (primary) hypertension: Secondary | ICD-10-CM | POA: Diagnosis not present

## 2023-04-04 DIAGNOSIS — E559 Vitamin D deficiency, unspecified: Secondary | ICD-10-CM

## 2023-04-04 DIAGNOSIS — E039 Hypothyroidism, unspecified: Secondary | ICD-10-CM

## 2023-04-04 DIAGNOSIS — Z0000 Encounter for general adult medical examination without abnormal findings: Secondary | ICD-10-CM | POA: Diagnosis not present

## 2023-04-04 LAB — MICROALBUMIN, URINE WAIVED
Creatinine, Urine Waived: 200 mg/dL (ref 10–300)
Microalb, Ur Waived: 80 mg/L — ABNORMAL HIGH (ref 0–19)

## 2023-04-04 MED ORDER — LOSARTAN POTASSIUM 100 MG PO TABS: 100.0000 mg | ORAL_TABLET | Freq: Every day | ORAL | 1 refills | Status: DC

## 2023-04-04 MED ORDER — NAPROXEN 500 MG PO TABS: 500.0000 mg | ORAL_TABLET | Freq: Two times a day (BID) | ORAL | 1 refills | Status: DC

## 2023-04-04 MED ORDER — OMEPRAZOLE 20 MG PO CPDR: 20.0000 mg | DELAYED_RELEASE_CAPSULE | Freq: Every day | ORAL | 1 refills | Status: DC

## 2023-04-04 NOTE — Assessment & Plan Note (Signed)
 Rechecking labs today. Await results. Treat as needed.

## 2023-04-04 NOTE — Progress Notes (Signed)
 BP 122/82 (BP Location: Left Arm, Patient Position: Sitting, Cuff Size: Large)   Pulse 62   Temp 98.4 F (36.9 C) (Oral)   Resp 17   Ht 6' 1.35" (1.863 m)   Wt 269 lb 6.4 oz (122.2 kg)   SpO2 97%   BMI 35.21 kg/m    Subjective:    Patient ID: Nicholas Wall, male    DOB: 06-30-1955, 68 y.o.   MRN: [id number]  HPI: Nicholas Wall is a 68 y.o. male presenting on 04/04/2023 for comprehensive medical examination. Current medical complaints include:  HYPERTENSION  Hypertension status: controlled  Satisfied with current treatment? no Duration of hypertension: chronic BP monitoring frequency:  not checking BP medication side effects:  no Medication compliance: excellent compliance Previous BP meds:losartan Aspirin: no Recurrent headaches: no Visual changes: no Palpitations: no Dyspnea: no Chest pain: no Lower extremity edema: no Dizzy/lightheaded: no  HYPOTHYROIDISM Thyroid control status:controlled Satisfied with current treatment? yes Medication side effects: no Medication compliance: excellent compliance Recent dose adjustment:no Fatigue: yes Cold intolerance: no Heat intolerance: no Weight gain: no Weight loss: no Constipation: no Diarrhea/loose stools: no Palpitations: no Lower extremity edema: no Anxiety/depressed mood: no  He currently lives with: wife Interim Problems from his last visit: no  Depression Screen done today and results listed below:     04/04/2023    9:07 AM 10/04/2022   10:05 AM 04/05/2022    3:26 PM 10/12/2021    3:22 PM 03/26/2021   11:11 AM  Depression screen PHQ 2/9  Decreased Interest 0 1 0 1 1  Down, Depressed, Hopeless 0 1 0 1 1  PHQ - 2 Score 0 2 0 2 2  Altered sleeping 0 0 1 1 3   Tired, decreased energy 1 1 1 2 3   Change in appetite 1 0 0 0 0  Feeling bad or failure about yourself  0 0 0 0 1  Trouble concentrating 0 0 0 0 0  Moving slowly or fidgety/restless 0 0 0 0 0  Suicidal thoughts 0 0 0 0 0  PHQ-9 Score 2 3 2 5 9    Difficult doing work/chores Not difficult at all Somewhat difficult Not difficult at all Somewhat difficult Somewhat difficult   Past Medical History:  Past Medical History:  Diagnosis Date   Allergic rhinitis    GERD (gastroesophageal reflux disease)    HTN (hypertension)    Hypothyroidism    Left inguinal hernia 07/2009   Polymyalgia rheumatica (HCC)     Surgical History:  Past Surgical History:  Procedure Laterality Date   BACK SURGERY     1988-- L4-L5 laminectomy   COLONOSCOPY N/A 02/16/2021   Procedure: COLONOSCOPY;  Surgeon: Wyline Mood, MD;  Location: [facility];  Service: Gastroenterology;  Laterality: N/A;   HERNIA REPAIR Left 2011    Medications:  Current Outpatient Medications on File Prior to Visit  Medication Sig   ciclopirox (LOPROX) 0.77 % cream Apply topically to feet and toes bid   fluconazole (DIFLUCAN) 200 MG tablet Take 1 tablet (200 mg total) by mouth daily. (Patient taking differently: Take 200 mg by mouth once a week.)   levothyroxine (SYNTHROID) 88 MCG tablet TAKE 1 TABLET BY MOUTH EVERY DAY   No current facility-administered medications on file prior to visit.    Allergies:  No Known Allergies  Social History:  Social History   Socioeconomic History   Marital status: Married    Spouse name: Not on file   Number of children: Not on file  Years of education: Not on file   Highest education level: Some college, no degree  Occupational History   Not on file  Tobacco Use   Smoking status: Never    Passive exposure: Never   Smokeless tobacco: Former    Types: Chew, Snuff  Vaping Use   Vaping status: Never Used  Substance and Sexual Activity   Alcohol use: Yes    Alcohol/week: 10.0 standard drinks of alcohol    Types: 10 Cans of beer per week   Drug use: No   Sexual activity: Not Currently  Other Topics Concern   Not on file  Social History Narrative   Not on file   Social Drivers of Health   Financial Resource Strain: Medium  Risk (04/04/2023)   Overall Financial Resource Strain (CARDIA)    Difficulty of Paying Living Expenses: Somewhat hard  Food Insecurity: No Food Insecurity (04/04/2023)   Hunger Vital Sign    Worried About Running Out of Food in the Last Year: Never true    Ran Out of Food in the Last Year: Never true  Transportation Needs: No Transportation Needs (04/04/2023)   PRAPARE - Administrator, Civil Service (Medical): No    Lack of Transportation (Non-Medical): No  Physical Activity: Sufficiently Active (04/04/2023)   Exercise Vital Sign    Days of Exercise per Week: 4 days    Minutes of Exercise per Session: 120 min  Stress: No Stress Concern Present (04/04/2023)   [employer] of Occupational Health - Occupational Stress Questionnaire    Feeling of Stress : Only a little  Social Connections: Moderately Isolated (04/04/2023)   Social Connection and Isolation Panel [NHANES]    Frequency of Communication with Friends and Family: Once a week    Frequency of Social Gatherings with Friends and Family: Once a week    Attends Religious Services: 1 to 4 times per year    Active Member of [employer] or Organizations: No    Attends Engineer, structural: Not on file    Marital Status: Married  Catering manager Violence: Not At Risk (04/04/2023)   Humiliation, Afraid, Rape, and Kick questionnaire    Fear of Current or Ex-Partner: No    Emotionally Abused: No    Physically Abused: No    Sexually Abused: No   Social History   Tobacco Use  Smoking Status Never   Passive exposure: Never  Smokeless Tobacco Former   Types: Chew, Snuff   Social History   Substance and Sexual Activity  Alcohol Use Yes   Alcohol/week: 10.0 standard drinks of alcohol   Types: 10 Cans of beer per week    Family History:  Family History  Problem Relation Age of Onset   Hypertension Mother    Hyperlipidemia Mother    Diabetes Mother    Arthritis Mother    Vision loss Mother    Thyroid disease  Mother    Cancer Father    Hyperlipidemia Father    Arthritis Father    Heart disease Father    Thyroid disease Brother    Hyperlipidemia Maternal Grandmother    Hypertension Maternal Grandmother    Alzheimer's disease Maternal Grandmother    Heart disease Maternal Grandfather    Hyperlipidemia Maternal Grandfather    Hypertension Maternal Grandfather    Cancer Paternal Grandmother    Thyroid disease Brother     Past medical history, surgical history, medications, allergies, family history and social history reviewed with patient today and changes made  to appropriate areas of the chart.   Review of Systems  Constitutional: Negative.   HENT:  Positive for ear pain. Negative for congestion, ear discharge, hearing loss, nosebleeds, sinus pain, sore throat and tinnitus.   Eyes: Negative.   Respiratory: Negative.  Negative for stridor.   Cardiovascular: Negative.   Gastrointestinal: Negative.   Genitourinary: Negative.   Musculoskeletal:  Positive for joint pain. Negative for back pain, falls, myalgias and neck pain.  Skin: Negative.   Neurological: Negative.   Endo/Heme/Allergies: Negative.   Psychiatric/Behavioral: Negative.     All other ROS negative except what is listed above and in the HPI.      Objective:    BP 122/82 (BP Location: Left Arm, Patient Position: Sitting, Cuff Size: Large)   Pulse 62   Temp 98.4 F (36.9 C) (Oral)   Resp 17   Ht 6' 1.35" (1.863 m)   Wt 269 lb 6.4 oz (122.2 kg)   SpO2 97%   BMI 35.21 kg/m   Wt Readings from Last 3 Encounters:  04/04/23 269 lb 6.4 oz (122.2 kg)  10/04/22 269 lb 12.8 oz (122.4 kg)  04/05/22 275 lb 8 oz (125 kg)    Physical Exam Vitals and nursing note reviewed.  Constitutional:      General: He is not in acute distress.    Appearance: Normal appearance. He is obese. He is not ill-appearing, toxic-appearing or diaphoretic.  HENT:     Head: Normocephalic and atraumatic.     Right Ear: Tympanic membrane, ear canal  and external ear normal. There is no impacted cerumen.     Left Ear: Tympanic membrane, ear canal and external ear normal. There is no impacted cerumen.     Nose: Nose normal. No congestion or rhinorrhea.     Mouth/Throat:     Mouth: Mucous membranes are moist.     Pharynx: Oropharynx is clear. No oropharyngeal exudate or posterior oropharyngeal erythema.  Eyes:     General: No scleral icterus.       Right eye: No discharge.        Left eye: No discharge.     Extraocular Movements: Extraocular movements intact.     Conjunctiva/sclera: Conjunctivae normal.     Pupils: Pupils are equal, round, and reactive to light.  Neck:     Vascular: No carotid bruit.  Cardiovascular:     Rate and Rhythm: Normal rate and regular rhythm.     Pulses: Normal pulses.     Heart sounds: No murmur heard.    No friction rub. No gallop.  Pulmonary:     Effort: Pulmonary effort is normal. No respiratory distress.     Breath sounds: Normal breath sounds. No stridor. No wheezing, rhonchi or rales.  Chest:     Chest wall: No tenderness.  Abdominal:     General: Abdomen is flat. Bowel sounds are normal. There is no distension.     Palpations: Abdomen is soft. There is no mass.     Tenderness: There is no abdominal tenderness. There is no right CVA tenderness, left CVA tenderness, guarding or rebound.     Hernia: No hernia is present.  Genitourinary:    Comments: Genital exam deferred with shared decision making Musculoskeletal:        General: No swelling, tenderness, deformity or signs of injury.     Cervical back: Normal range of motion and neck supple. No rigidity. No muscular tenderness.     Right lower leg: No edema.  Left lower leg: No edema.  Lymphadenopathy:     Cervical: No cervical adenopathy.  Skin:    General: Skin is warm and dry.     Capillary Refill: Capillary refill takes less than 2 seconds.     Coloration: Skin is not jaundiced or pale.     Findings: No bruising, erythema, lesion or  rash.  Neurological:     General: No focal deficit present.     Mental Status: He is alert and oriented to person, place, and time.     Cranial Nerves: No cranial nerve deficit.     Sensory: No sensory deficit.     Motor: No weakness.     Coordination: Coordination normal.     Gait: Gait normal.     Deep Tendon Reflexes: Reflexes normal.  Psychiatric:        Mood and Affect: Mood normal.        Behavior: Behavior normal.        Thought Content: Thought content normal.        Judgment: Judgment normal.     Results for orders placed or performed in visit on 10/04/22  Basic metabolic panel   Collection Time: 10/04/22 10:52 AM  Result Value Ref Range   Glucose 144 (H) 70 - 99 mg/dL   BUN 18 8 - 27 mg/dL   Creatinine, Ser 4.78 0.76 - 1.27 mg/dL   eGFR 96 >29 FA/OZH/0.86   BUN/Creatinine Ratio 21 10 - 24   Sodium 141 134 - 144 mmol/L   Potassium 4.4 3.5 - 5.2 mmol/L   Chloride 104 96 - 106 mmol/L   CO2 23 20 - 29 mmol/L   Calcium 8.9 8.6 - 10.2 mg/dL  TSH   Collection Time: 10/04/22 10:52 AM  Result Value Ref Range   TSH 2.750 0.450 - 4.500 uIU/mL  VITAMIN D 25 Hydroxy (Vit-D Deficiency, Fractures)   Collection Time: 10/04/22 10:52 AM  Result Value Ref Range   Vit D, 25-Hydroxy 30.4 30.0 - 100.0 ng/mL      Assessment & Plan:   Problem List Items Addressed This Visit       Cardiovascular and Mediastinum   Essential hypertension   Under good control on current regimen. Continue current regimen. Continue to monitor. Call with any concerns. Refills given. Labs drawn today.        Relevant Medications   losartan (COZAAR) 100 MG tablet   Other Relevant Orders   Microalbumin, Urine Waived     Digestive   GERD (gastroesophageal reflux disease)   Under good control on current regimen. Continue current regimen. Continue to monitor. Call with any concerns. Refills given. Labs drawn today.       Relevant Medications   omeprazole (PRILOSEC) 20 MG capsule     Endocrine    Hypothyroidism   Rechecking labs today. Await results. Treat as needed.       Relevant Orders   TSH     Other   Vitamin D deficiency   Rechecking labs today. Await results. Treat as needed.       Relevant Orders   VITAMIN D 25 Hydroxy (Vit-D Deficiency, Fractures)   Other Visit Diagnoses       Routine general medical examination at a health care facility    -  Primary   Vaccines declined. Screening labs checked today. Colonoscopy up to date. Continue diet and exercise. Call with any concerns.   Relevant Orders   Comprehensive metabolic panel   CBC with Differential/Platelet  Lipid Panel w/o Chol/HDL Ratio   PSA   TSH     Neck pain       Will send for x-ray. Offered PT, he would like to hold for now. Call with any concerns. Continue to monitor.   Relevant Orders   DG Cervical Spine Complete       LABORATORY TESTING:  Health maintenance labs ordered today as discussed above.   The natural history of prostate cancer and ongoing controversy regarding screening and potential treatment outcomes of prostate cancer has been discussed with the patient. The meaning of a false positive PSA and a false negative PSA has been discussed. He indicates understanding of the limitations of this screening test and wishes to proceed with screening PSA testing.   IMMUNIZATIONS:   - Tdap: Tetanus vaccination status reviewed: last tetanus booster within 10 years. - Influenza: Refused - Prevnar: Refused - COVID: Refused - HPV: Not applicable - Shingrix vaccine: Refused  SCREENING: - Colonoscopy: Up to date  Discussed with patient purpose of the colonoscopy is to detect colon cancer at curable precancerous or early stages    PATIENT COUNSELING:    Sexuality: Discussed sexually transmitted diseases, partner selection, use of condoms, avoidance of unintended pregnancy  and contraceptive alternatives.   Advised to avoid cigarette smoking.  I discussed with the patient that most people  either abstain from alcohol or drink within safe limits (<=14/week and <=4 drinks/occasion for males, <=7/weeks and <= 3 drinks/occasion for females) and that the risk for alcohol disorders and other health effects rises proportionally with the number of drinks per week and how often a drinker exceeds daily limits.  Discussed cessation/primary prevention of drug use and availability of treatment for abuse.   Diet: Encouraged to adjust caloric intake to maintain  or achieve ideal body weight, to reduce intake of dietary saturated fat and total fat, to limit sodium intake by avoiding high sodium foods and not adding table salt, and to maintain adequate dietary potassium and calcium preferably from fresh fruits, vegetables, and low-fat dairy products.    stressed the importance of regular exercise  Injury prevention: Discussed safety belts, safety helmets, smoke detector, smoking near bedding or upholstery.   Dental health: Discussed importance of regular tooth brushing, flossing, and dental visits.   Follow up plan: NEXT PREVENTATIVE PHYSICAL DUE IN 1 YEAR. Return in about 6 months (around 10/05/2023).

## 2023-04-04 NOTE — Assessment & Plan Note (Signed)
 Under good control on current regimen. Continue current regimen. Continue to monitor. Call with any concerns. Refills given. Labs drawn today.

## 2023-04-05 LAB — LIPID PANEL W/O CHOL/HDL RATIO
Cholesterol, Total: 149 mg/dL (ref 100–199)
HDL: 53 mg/dL (ref >39)
LDL Chol Calc (NIH): 78 mg/dL (ref 0–99)
Triglycerides: 96 mg/dL (ref 0–149)
VLDL Cholesterol Cal: 18 mg/dL (ref 5–40)

## 2023-04-05 LAB — CBC WITH DIFFERENTIAL/PLATELET
Basophils Absolute: 0.1 10*3/uL (ref 0.0–0.2)
Basos: 1 %
EOS (ABSOLUTE): 0.3 10*3/uL (ref 0.0–0.4)
Eos: 4 %
Hematocrit: 40.5 % (ref 37.5–51.0)
Hemoglobin: 13.9 g/dL (ref 13.0–17.7)
Immature Grans (Abs): 0 10*3/uL (ref 0.0–0.1)
Immature Granulocytes: 0 %
Lymphocytes Absolute: 2 10*3/uL (ref 0.7–3.1)
Lymphs: 27 %
MCH: 33.1 pg — ABNORMAL HIGH (ref 26.6–33.0)
MCHC: 34.3 g/dL (ref 31.5–35.7)
MCV: 96 fL (ref 79–97)
Monocytes Absolute: 0.6 10*3/uL (ref 0.1–0.9)
Monocytes: 8 %
Neutrophils Absolute: 4.5 10*3/uL (ref 1.4–7.0)
Neutrophils: 60 %
Platelets: 211 10*3/uL (ref 150–450)
RBC: 4.2 x10E6/uL (ref 4.14–5.80)
RDW: 12.8 % (ref 11.6–15.4)
WBC: 7.4 10*3/uL (ref 3.4–10.8)

## 2023-04-05 LAB — COMPREHENSIVE METABOLIC PANEL
ALT: 79 IU/L — ABNORMAL HIGH (ref 0–44)
AST: 56 IU/L — ABNORMAL HIGH (ref 0–40)
Albumin: 4.3 g/dL (ref 3.9–4.9)
Alkaline Phosphatase: 80 IU/L (ref 44–121)
BUN/Creatinine Ratio: 19 (ref 10–24)
BUN: 17 mg/dL (ref 8–27)
Bilirubin Total: 0.5 mg/dL (ref 0.0–1.2)
CO2: 25 mmol/L (ref 20–29)
Calcium: 9.1 mg/dL (ref 8.6–10.2)
Chloride: 105 mmol/L (ref 96–106)
Creatinine, Ser: 0.89 mg/dL (ref 0.76–1.27)
Globulin, Total: 1.9 g/dL (ref 1.5–4.5)
Glucose: 192 mg/dL — ABNORMAL HIGH (ref 70–99)
Potassium: 4.8 mmol/L (ref 3.5–5.2)
Sodium: 143 mmol/L (ref 134–144)
Total Protein: 6.2 g/dL (ref 6.0–8.5)
eGFR: 95 mL/min/{1.73_m2} (ref >59)

## 2023-04-05 LAB — VITAMIN D 25 HYDROXY (VIT D DEFICIENCY, FRACTURES): Vit D, 25-Hydroxy: 24.5 ng/mL — ABNORMAL LOW (ref 30.0–100.0)

## 2023-04-05 LAB — PSA: Prostate Specific Ag, Serum: 1.3 ng/mL (ref 0.0–4.0)

## 2023-04-05 LAB — TSH: TSH: 3.34 u[IU]/mL (ref 0.450–4.500)

## 2023-04-11 ENCOUNTER — Other Ambulatory Visit: Payer: Self-pay | Admitting: Family Medicine

## 2023-04-11 ENCOUNTER — Encounter: Payer: Self-pay | Admitting: Family Medicine

## 2023-04-11 MED ORDER — LEVOTHYROXINE SODIUM 88 MCG PO TABS: 88.0000 ug | ORAL_TABLET | Freq: Every day | ORAL | 3 refills | Status: AC

## 2023-04-18 IMAGING — CR DG HAND COMPLETE 3+V*L*
3 series · 3 of 3 positions shown · non-contrast
Comparison: None.

CLINICAL DATA: Bilateral hand pain and numbness for 2 years

EXAM:
LEFT HAND - COMPLETE 3+ VIEW; RIGHT HAND - COMPLETE 3+ VIEW

[hand ap]
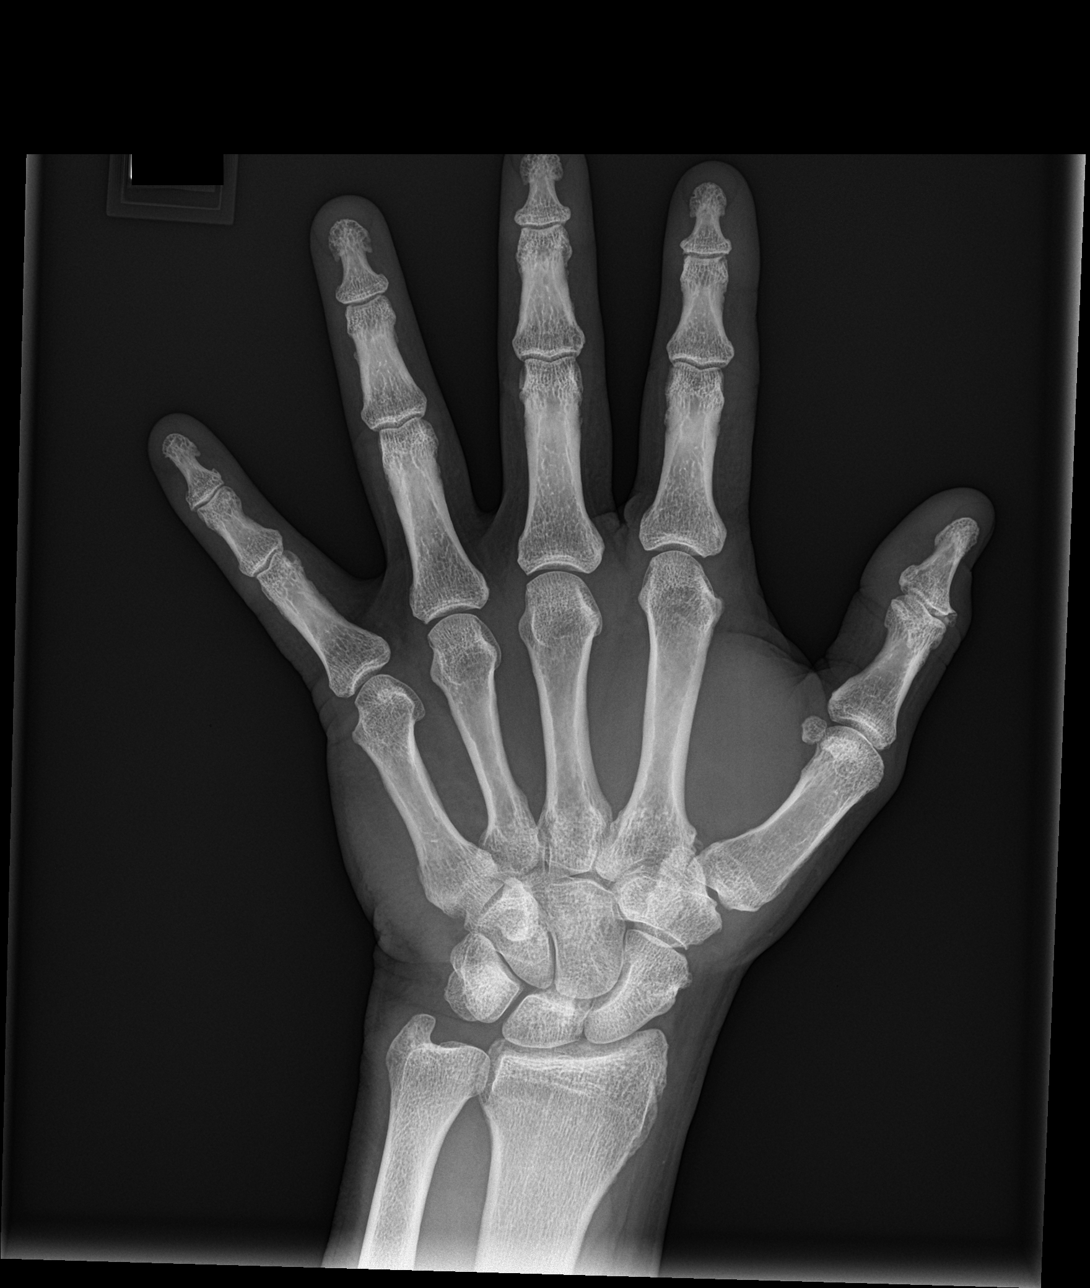

[hand obl]
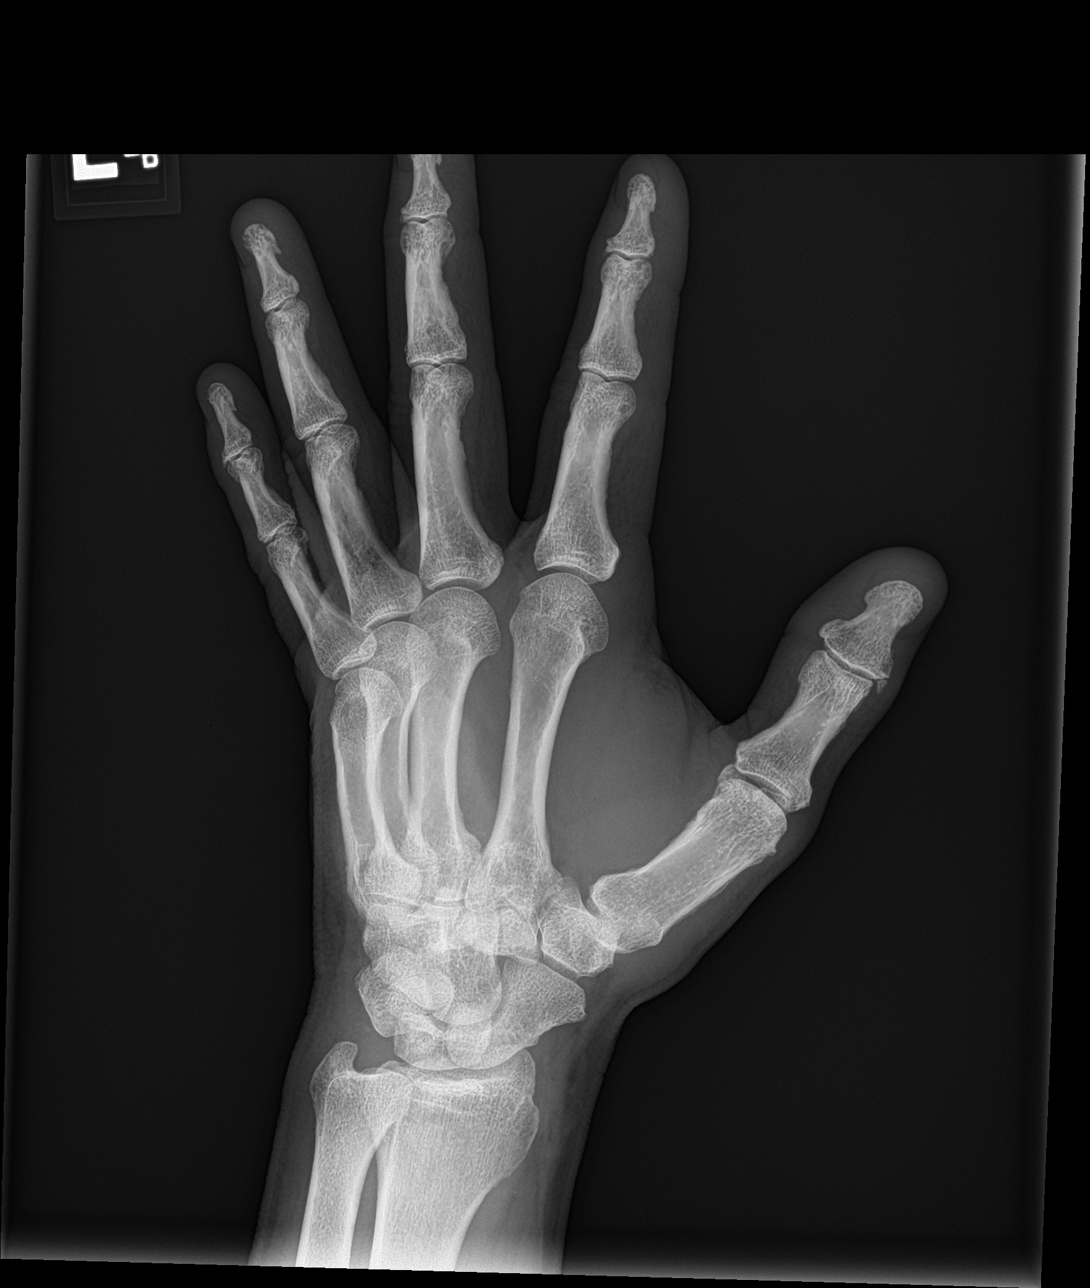

[hand lat]
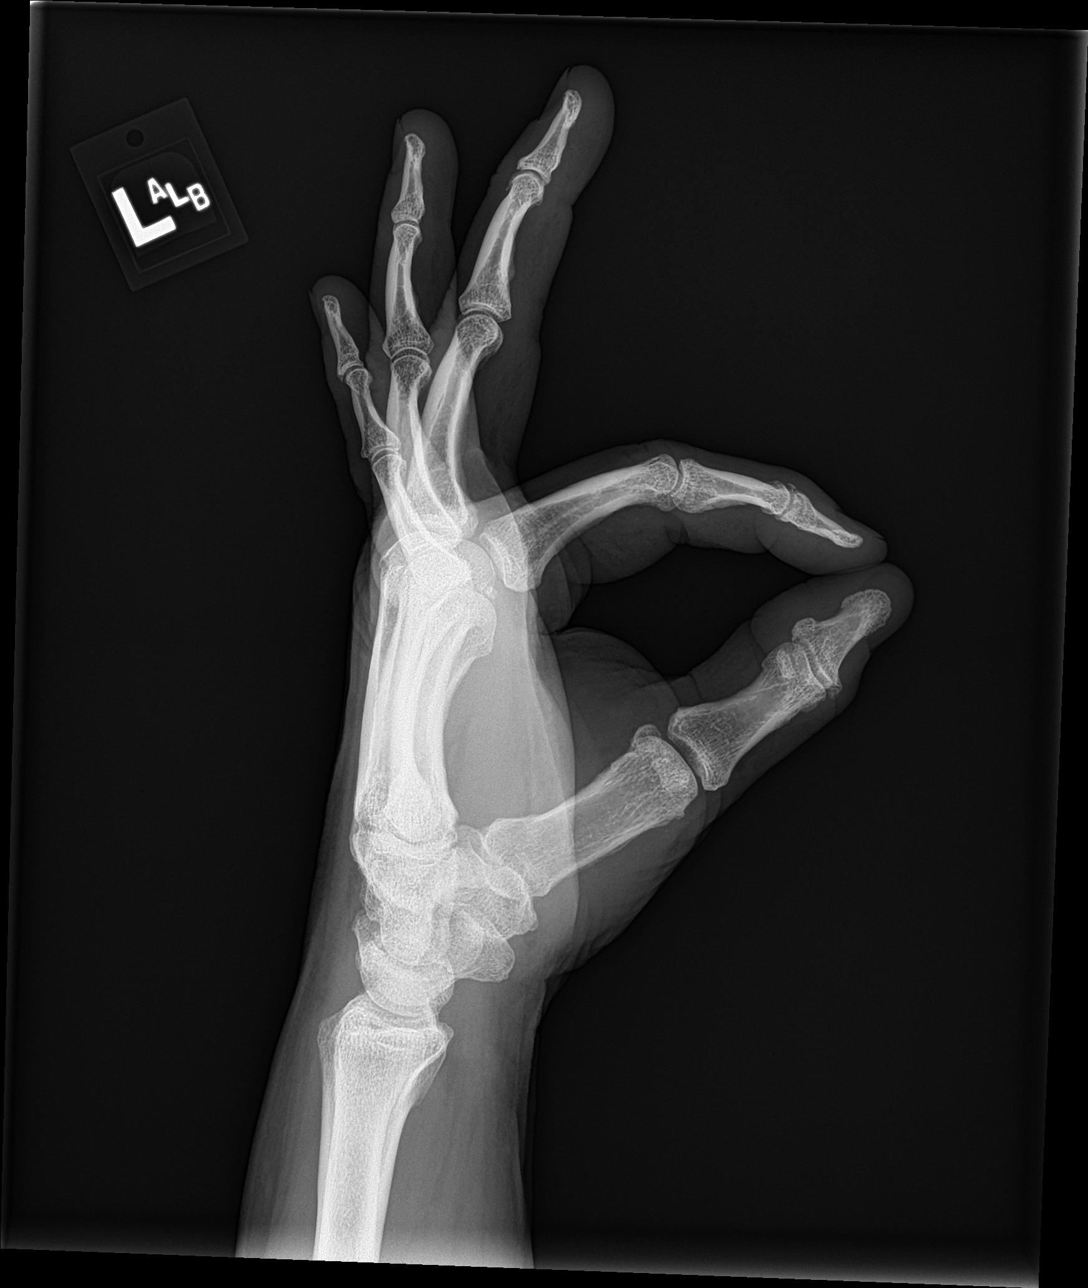

[3 of 3 positions shown; findings below may reference images not displayed]

FINDINGS: There is no evidence of fracture or dislocation. Mild, symmetric
osteoarthritic pattern arthrosis throughout the bilateral hands and
wrists, most conspicuous in the distal interphalangeal joints. Soft
tissues are unremarkable.
IMPRESSION: No fracture or dislocation of the bilateral hands. Mild, symmetric
osteoarthritic pattern arthrosis throughout the bilateral hands and
wrists.

## 2023-04-18 IMAGING — CR DG HAND COMPLETE 3+V*R*
3 series · 3 of 3 positions shown · non-contrast
Comparison: None.

CLINICAL DATA: Bilateral hand pain and numbness for 2 years

EXAM:
LEFT HAND - COMPLETE 3+ VIEW; RIGHT HAND - COMPLETE 3+ VIEW

[hand ap]
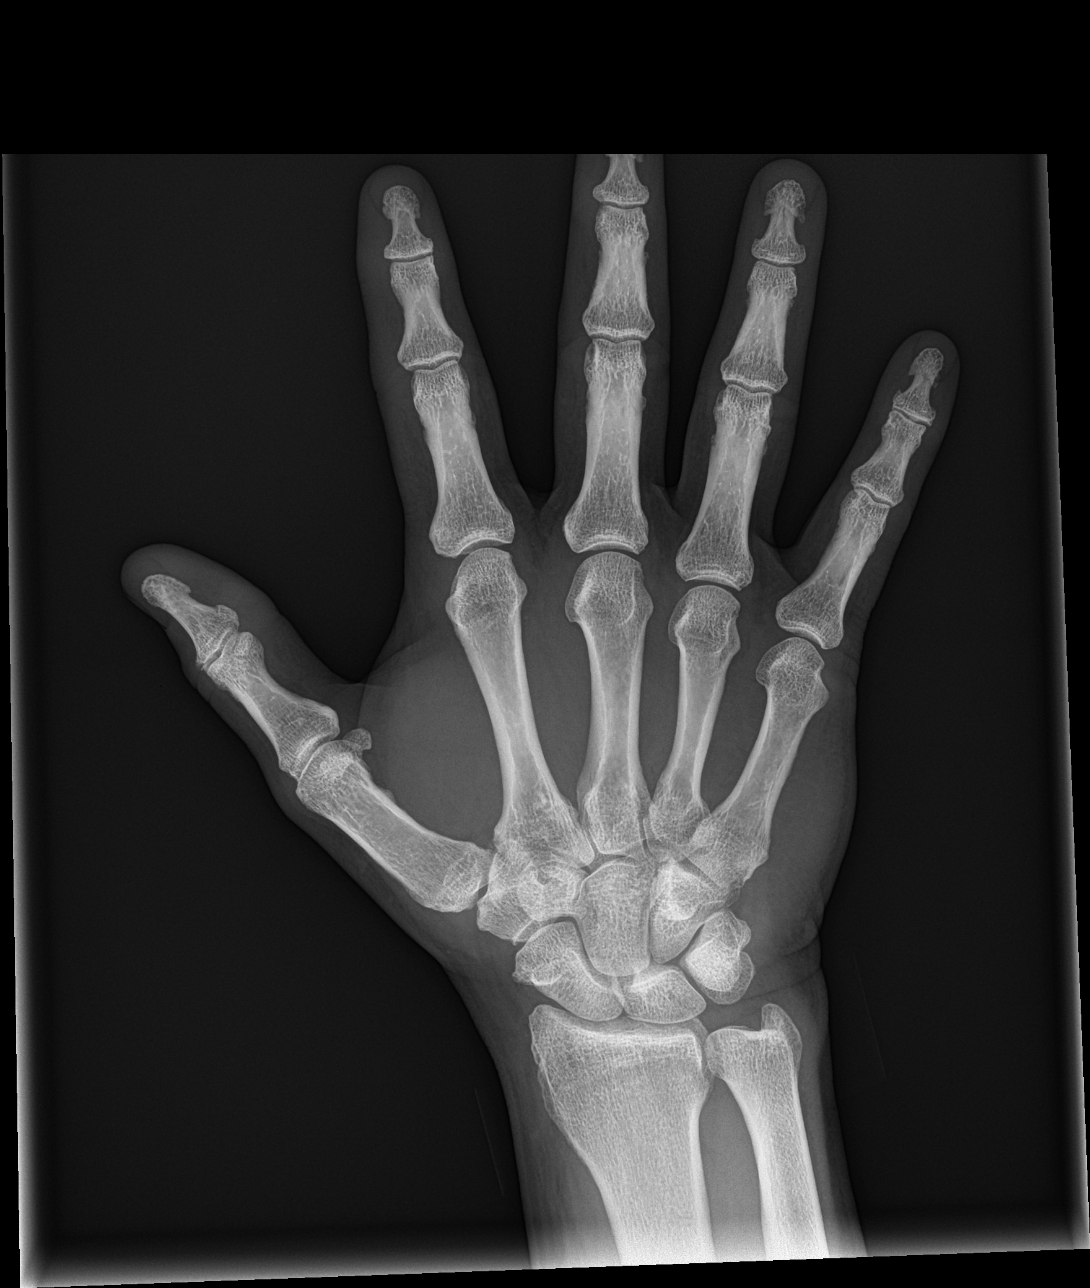

[hand obl]
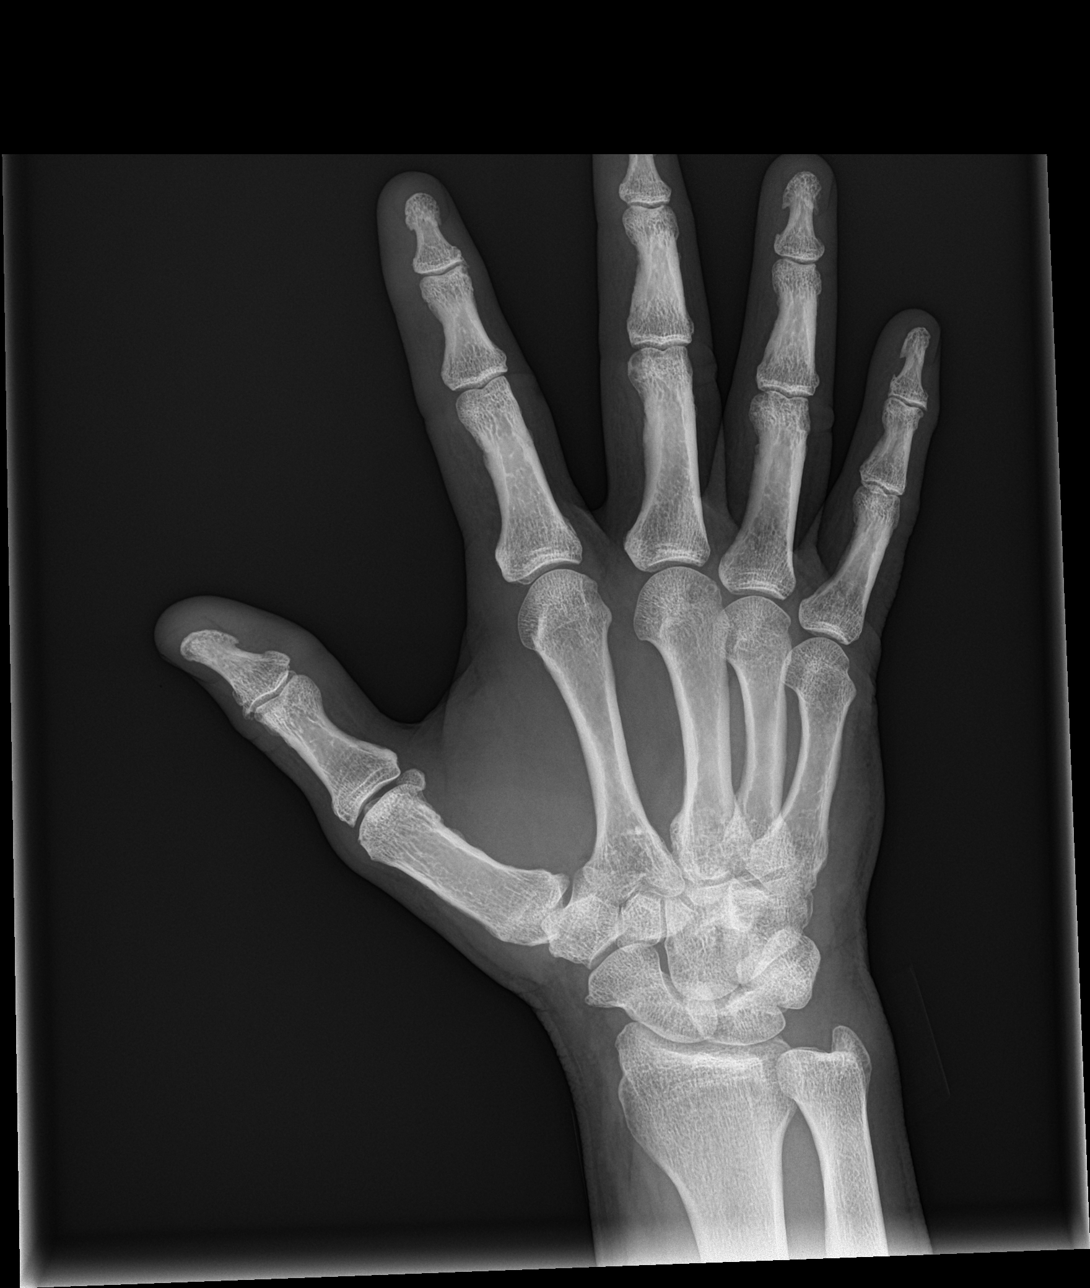

[hand lat]
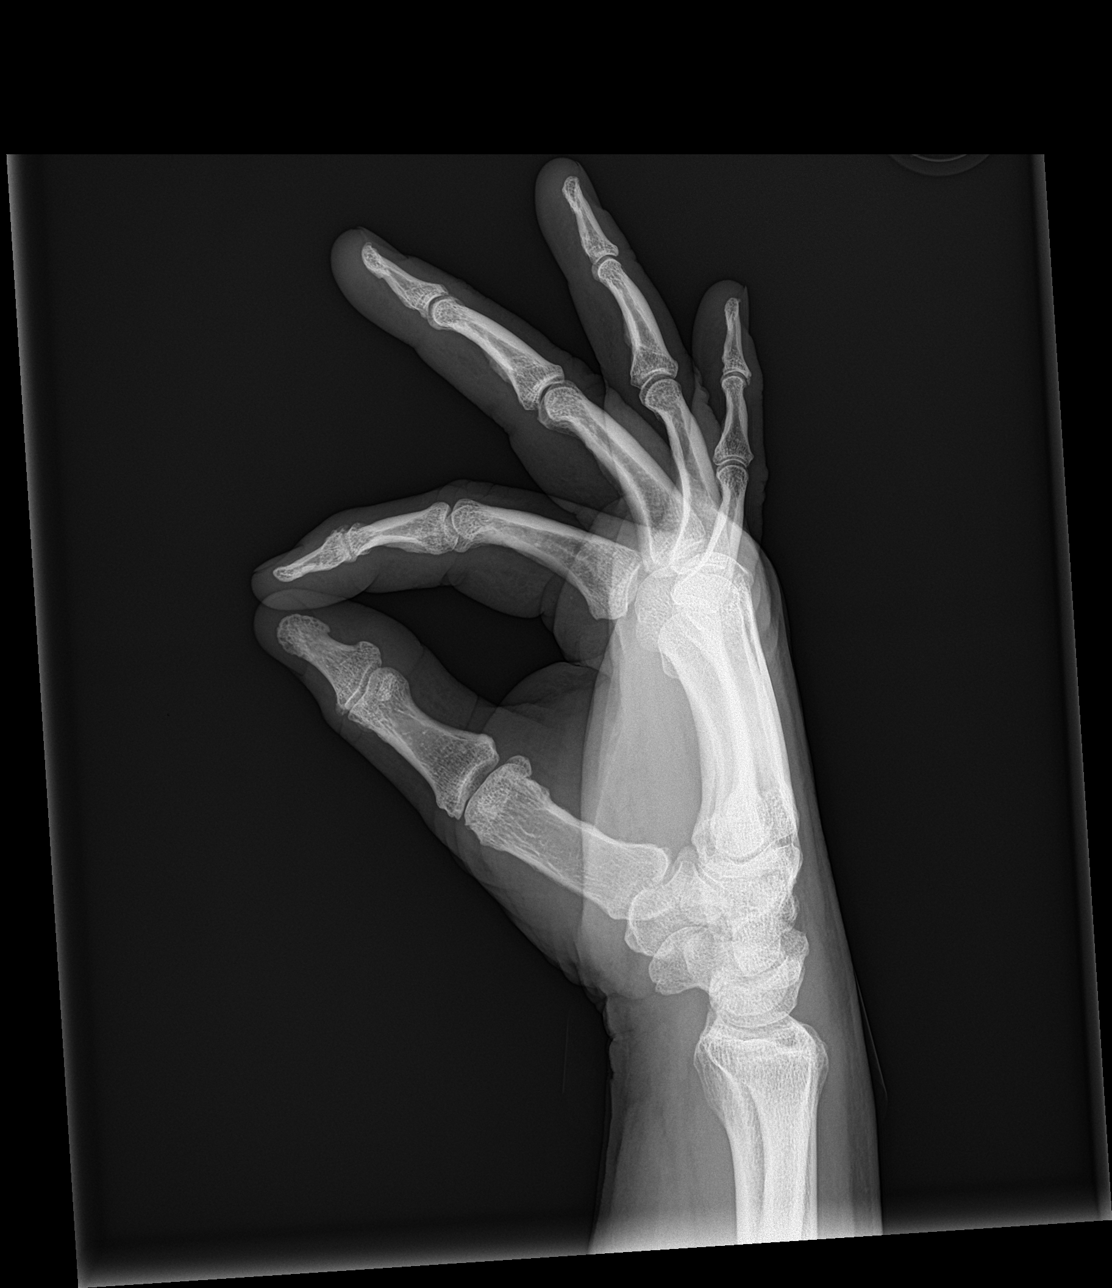

[3 of 3 positions shown; findings below may reference images not displayed]

FINDINGS: There is no evidence of fracture or dislocation. Mild, symmetric
osteoarthritic pattern arthrosis throughout the bilateral hands and
wrists, most conspicuous in the distal interphalangeal joints. Soft
tissues are unremarkable.
IMPRESSION: No fracture or dislocation of the bilateral hands. Mild, symmetric
osteoarthritic pattern arthrosis throughout the bilateral hands and
wrists.

## 2023-04-18 IMAGING — CR DG FOOT COMPLETE 3+V*R*
3 series · 3 of 3 positions shown · non-contrast
Comparison: None.

CLINICAL DATA: Bilateral foot pain, 2 years

EXAM:
RIGHT FOOT COMPLETE - 3+ VIEW; LEFT FOOT - COMPLETE 3+ VIEW

[foot ap]
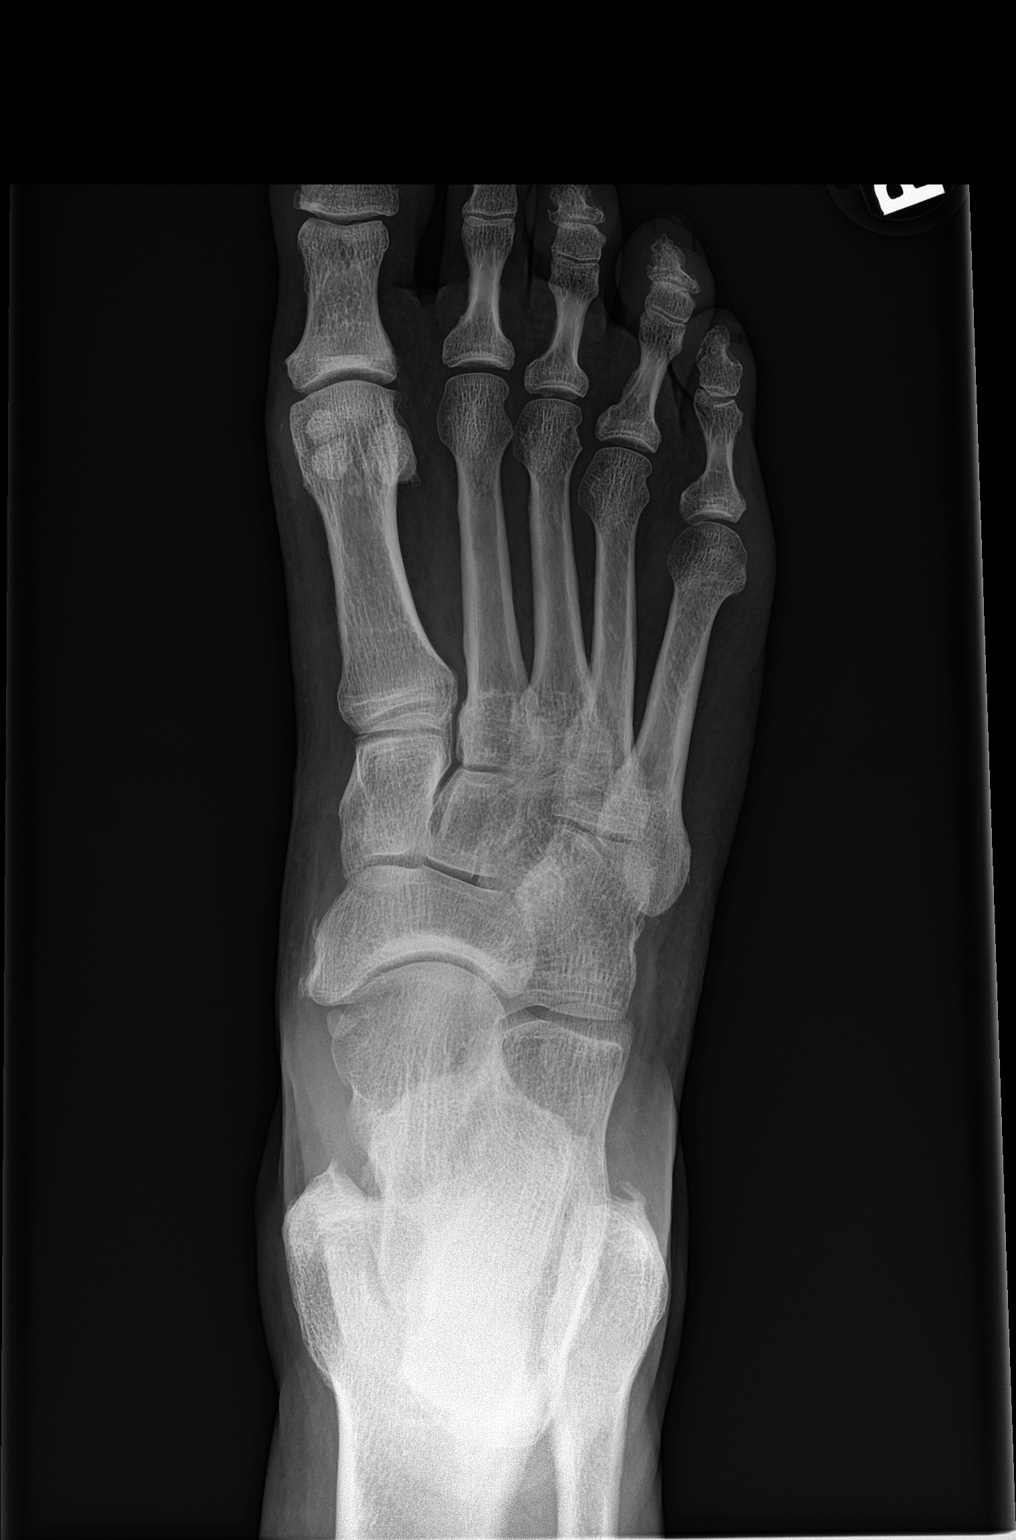

[foot obl]
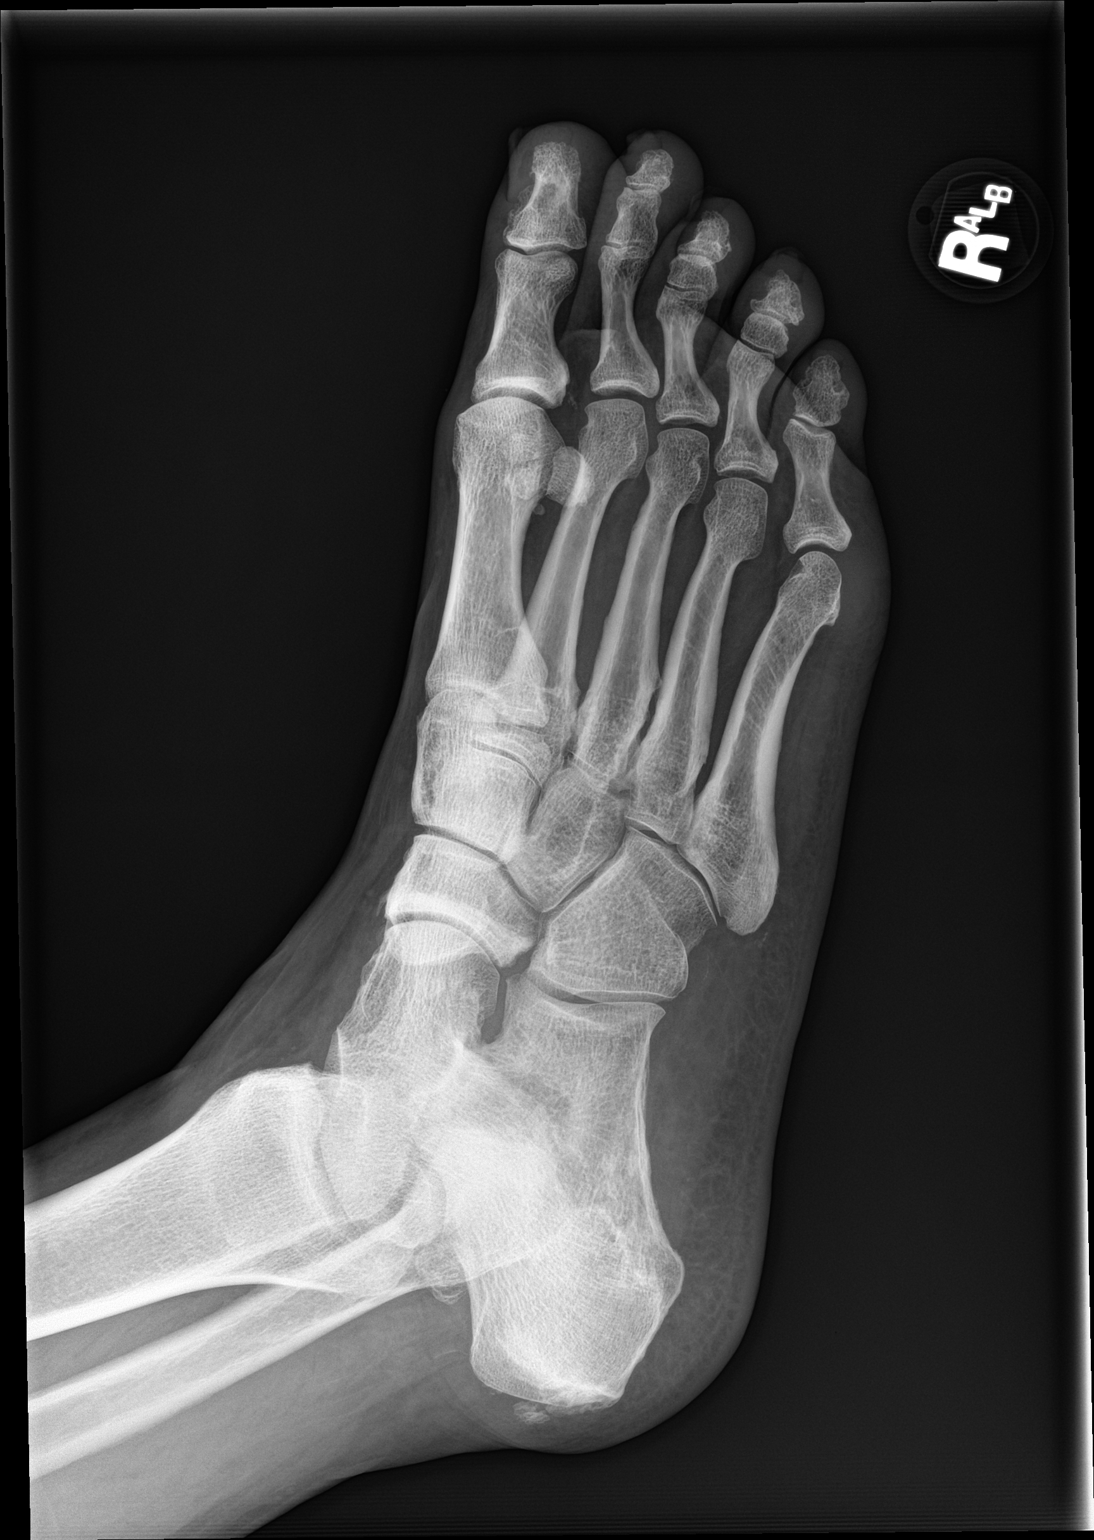

[foot lat]
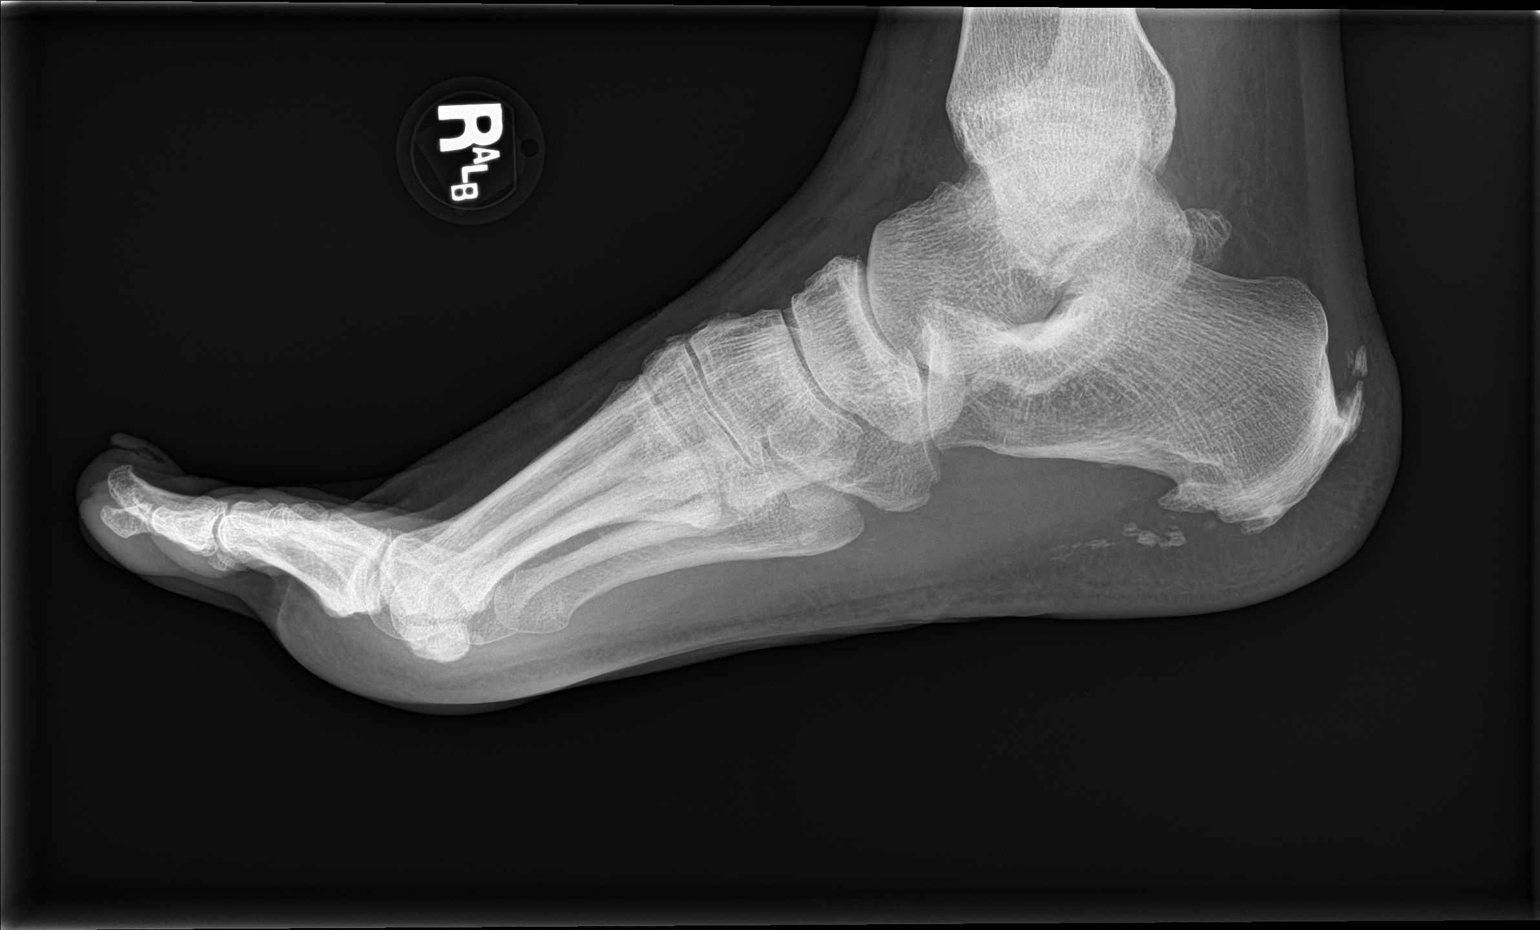

[3 of 3 positions shown; findings below may reference images not displayed]

FINDINGS: There is no evidence of fracture or dislocation. Minimal arthrosis
of the bilateral first metatarsophalangeal joints and mid feet.
There is enthesopathic change about the calcaneus and within the
plantar fascia and distal Achilles tendons bilaterally. Soft tissues
are unremarkable.
IMPRESSION: 1. No fracture or dislocation of the bilateral feet.
2. Minimal arthrosis of the bilateral first metatarsophalangeal
joints and mid feet.
3. Enthesopathic change about the calcaneus and within the plantar
fascia and distal Achilles tendons bilaterally.

## 2023-04-25 ENCOUNTER — Ambulatory Visit
Admission: RE | Admit: 2023-04-25 | Discharge: 2023-04-25 | Disposition: A | Discharge status: Home / Self Care | Attending: Family Medicine | Admitting: Family Medicine

## 2023-04-25 ENCOUNTER — Ambulatory Visit
Admission: RE | Admit: 2023-04-25 | Discharge: 2023-04-25 | Disposition: A | Discharge status: Home / Self Care | Source: Ambulatory Visit | Attending: Family Medicine | Admitting: Family Medicine

## 2023-04-25 DIAGNOSIS — M542 Cervicalgia: Secondary | ICD-10-CM | POA: Insufficient documentation

## 2023-04-28 ENCOUNTER — Encounter: Payer: Self-pay | Admitting: Family Medicine

## 2023-06-17 ENCOUNTER — Ambulatory Visit: Payer: Managed Care, Other (non HMO) | Admitting: Dermatology

## 2023-07-30 ENCOUNTER — Ambulatory Visit: Admitting: Dermatology

## 2023-07-30 ENCOUNTER — Encounter: Payer: Self-pay | Admitting: Dermatology

## 2023-07-30 DIAGNOSIS — L821 Other seborrheic keratosis: Secondary | ICD-10-CM

## 2023-07-30 DIAGNOSIS — B353 Tinea pedis: Secondary | ICD-10-CM

## 2023-07-30 DIAGNOSIS — B351 Tinea unguium: Secondary | ICD-10-CM | POA: Diagnosis not present

## 2023-07-30 DIAGNOSIS — W908XXA Exposure to other nonionizing radiation, initial encounter: Secondary | ICD-10-CM | POA: Diagnosis not present

## 2023-07-30 DIAGNOSIS — L570 Actinic keratosis: Secondary | ICD-10-CM | POA: Diagnosis not present

## 2023-07-30 DIAGNOSIS — L578 Other skin changes due to chronic exposure to nonionizing radiation: Secondary | ICD-10-CM | POA: Diagnosis not present

## 2023-07-30 DIAGNOSIS — Z872 Personal history of diseases of the skin and subcutaneous tissue: Secondary | ICD-10-CM

## 2023-07-30 MED ORDER — JUBLIA 10 % EX SOLN: 1.0000 | Freq: Every day | CUTANEOUS | 6 refills | Status: AC

## 2023-07-30 NOTE — Progress Notes (Signed)
 Follow-Up Visit   Subjective  Nicholas Wall is a 68 y.o. male who presents for the following: Tinea Unguium/Pedis feet, toenails, 3m f/u, Ciclopirox  cr bid, Fluconazole  200mg  1 po qwk, some improvement, AK 42m f/u, L lat cheek, L lat forehead- seems clear   The following portions of the chart were reviewed this encounter and updated as appropriate: medications, allergies, medical history  Review of Systems:  No other skin or systemic complaints except as noted in HPI or Assessment and Plan.  Objective  Well appearing patient in no apparent distress; mood and affect are within normal limits.   A focused examination was performed of the following areas: Face, feet  Relevant exam findings are noted in the Assessment and Plan.  Toenails     L upper forehead x 1 Pink scaly macules  Assessment & Plan   HISTORY OF PRECANCEROUS ACTINIC KERATOSIS - site(s) of PreCancerous Actinic Keratosis clear today at Left temple/. - these may recur and new lesions may form requiring treatment to prevent transformation into skin cancer - observe for new or changing spots and contact [facility] for appointment if occur - photoprotection with sun protective clothing; sunglasses and broad spectrum sunscreen with SPF of at least 30 + and frequent self skin exams recommended - yearly exams by a dermatologist recommended for persons with history of PreCancerous Actinic Keratoses    ONYCHOMYCOSIS with hx of TINEA PEDIS Currently on Fluconazole  (Podiatrist did not recommend starting terbinafine , due to mildly elevated LFTs) Toenails, feet Exam: some improvement as compared to photos from 02/24/23, plantar feet clear, thickening of toenails with discoloration, 100% involvement bil great toenails with some purpura, see new photos  Chronic and persistent condition with duration or expected duration over one year. Condition is symptomatic/ bothersome to patient. Not currently at goal.  Tinea  Pedis is clear, still severe involvement of BL great toenails  Treatment Plan: Will recheck LFTs, pending labs will send in Fluconazole  200mg  #22 for full year course, 0rf Cont Fluconazole  200mg  1 po q wk  Cont Ciclopirox  cr around toenails and in between toes decreasing to qhs Start Jublia  solution qhs to bil great toenails (if not covered/too expensive can send in Kerydin with Good Rx) If not improved on f/up, will do Fungal ID molecular test.  Side effects of fluconazole  (diflucan ) include nausea, diarrhea, headache, dizziness, taste changes, rare risk of irritation of the liver, allergy, or decreased blood counts (which could show up as infection or tiredness).  Long term medication management.  Patient is using long term (months to years) prescription medication  to control their dermatologic condition.  These medications require periodic monitoring to evaluate for efficacy and side effects and may require periodic laboratory monitoring.   SEBORRHEIC KERATOSIS face - Stuck-on, waxy, tan-brown papules - Benign-appearing - Discussed benign etiology and prognosis. - Observe - Call for any changes  TINEA UNGUIUM   Related Procedures Hepatic Function Panel Related Medications fluconazole  (DIFLUCAN ) 200 MG tablet Take 1 tablet (200 mg total) by mouth daily. ciclopirox  (LOPROX ) 0.77 % cream Apply topically to feet and toes bid AK (ACTINIC KERATOSIS) L upper forehead x 1 Actinic keratoses are precancerous spots that appear secondary to cumulative UV radiation exposure/sun exposure over time. They are chronic with expected duration over 1 year. A portion of actinic keratoses will progress to squamous cell carcinoma of the skin. It is not possible to reliably predict which spots will progress to skin cancer and so treatment is recommended to prevent development of  skin cancer.  Recommend daily broad spectrum sunscreen SPF 30+ to sun-exposed areas, reapply every 2 hours as needed.   Recommend staying in the shade or wearing long sleeves, sun glasses (UVA+UVB protection) and wide brim hats (4-inch brim around the entire circumference of the hat). Call for new or changing lesions. Destruction of lesion - L upper forehead x 1  Destruction method: cryotherapy   Informed consent: discussed and consent obtained   Timeout:  patient name, date of birth, surgical site, and procedure verified Lesion destroyed using liquid nitrogen: Yes   Region frozen until ice ball extended beyond lesion: Yes   Outcome: patient tolerated procedure well with no complications   Post-procedure details: wound care instructions given   Additional details:  Prior to procedure, discussed risks of blister formation, small wound, skin dyspigmentation, or rare scar following cryotherapy. Recommend Vaseline ointment to treated areas while healing.    ACTINIC DAMAGE - chronic, secondary to cumulative UV radiation exposure/sun exposure over time - diffuse scaly erythematous macules with underlying dyspigmentation - Recommend daily broad spectrum sunscreen SPF 30+ to sun-exposed areas, reapply every 2 hours as needed.  - Recommend staying in the shade or wearing long sleeves, sun glasses (UVA+UVB protection) and wide brim hats (4-inch brim around the entire circumference of the hat). - Call for new or changing lesions.   Return in about 6 months (around 01/30/2024) for Tinea Unguium, AK f/u.  I, Grayce Saunas, RMA, am acting as scribe for Rexene Rattler, MD .   Documentation: I have reviewed the above documentation for accuracy and completeness, and I agree with the above.  Rexene Rattler, MD

## 2023-07-30 NOTE — Patient Instructions (Addendum)
 Continue Fluconazole  200mg  1 pill a week Continue Ciclopirox  cream decreasing to just applying nightly, apply around toenails and in between toes Start Jublia  nightly to the great toenails  Jublia  sent to [facility] Your prescription was sent to [facility] in [place]. A representative from [facility] will contact you within 3 business hours to verify your address and insurance information to schedule a free delivery. If for any reason you do not receive a phone call from them, please reach out to them. Their phone number is [phone] and their hours are Monday-Friday 9:00 am-5:00 pm.     Cryotherapy Aftercare  Wash gently with soap and water everyday.   Apply Vaseline and Band-Aid daily until healed.   Due to recent changes in healthcare laws, you may see results of your pathology and/or laboratory studies on MyChart before the doctors have had a chance to review them. We understand that in some cases there may be results that are confusing or concerning to you. Please understand that not all results are received at the same time and often the doctors may need to interpret multiple results in order to provide you with the best plan of care or course of treatment. Therefore, we ask that you please give us  2 business days to thoroughly review all your results before contacting the office for clarification. Should we see a critical lab result, you will be contacted sooner.   If You Need Anything After Your Visit  If you have any questions or concerns for your doctor, please call our main line at [phone] and press option 4 to reach your doctor's medical assistant. If no one answers, please leave a voicemail as directed and we will return your call as soon as possible. Messages left after 4 pm will be answered the following business day.   You may also send us  a message via MyChart. We typically respond to MyChart messages within 1-2 business days.  For prescription refills,  please ask your pharmacy to contact our office. Our fax number is [phone].  If you have an urgent issue when the clinic is closed that cannot wait until the next business day, you can page your doctor at the number below.    Please note that while we do our best to be available for urgent issues outside of office hours, we are not available 24/7.   If you have an urgent issue and are unable to reach us , you may choose to seek medical care at your doctor's office, retail clinic, urgent care center, or emergency room.  If you have a medical emergency, please immediately call 911 or go to the emergency department.  Pager Numbers  - Dr. Hester: [phone]  - Dr. Jackquline: [phone]  - Dr. Claudene: [phone]   In the event of inclement weather, please call our main line at [phone] for an update on the status of any delays or closures.  Dermatology Medication Tips: Please keep the boxes that topical medications come in in order to help keep track of the instructions about where and how to use these. Pharmacies typically print the medication instructions only on the boxes and not directly on the medication tubes.   If your medication is too expensive, please contact our office at [phone] option 4 or send us  a message through MyChart.   We are unable to tell what your co-pay for medications will be in advance as this is different depending on your insurance coverage. However, we may be able to find a  substitute medication at lower cost or fill out paperwork to get insurance to cover a needed medication.   If a prior authorization is required to get your medication covered by your insurance company, please allow us  1-2 business days to complete this process.  Drug prices often vary depending on where the prescription is filled and some pharmacies may offer cheaper prices.  The website www.goodrx.com contains coupons for medications through different pharmacies. The prices  here do not account for what the cost may be with help from insurance (it may be cheaper with your insurance), but the website can give you the price if you did not use any insurance.  - You can print the associated coupon and take it with your prescription to the pharmacy.  - You may also stop by our office during regular business hours and pick up a GoodRx coupon card.  - If you need your prescription sent electronically to a different pharmacy, notify our office through [facility] or by phone at [phone] option 4.     Si Usted Necesita Algo Despus de Su Visita  Tambin puede enviarnos un mensaje a travs de Clinical cytogeneticist. Por lo general respondemos a los mensajes de MyChart en el transcurso de 1 a 2 das hbiles.  Para renovar recetas, por favor pida a su farmacia que se ponga en contacto con nuestra oficina. Randi lakes de fax es [place] [phone].  Si tiene un asunto urgente cuando la clnica est cerrada y que no puede esperar hasta el siguiente da hbil, puede llamar/localizar a su doctor(a) al nmero que aparece a continuacin.   Por favor, tenga en cuenta que aunque hacemos todo lo posible para estar disponibles para asuntos urgentes fuera del horario de [place], no estamos disponibles las 24 horas del da, los 7 [address]  [address] de la [place].   Si tiene un problema urgente y no puede comunicarse con nosotros, puede optar por buscar atencin mdica  en el consultorio de su doctor(a), en una clnica privada, en un centro de atencin urgente o en una sala de emergencias.  Si tiene Engineer, drilling, por favor llame inmediatamente al 911 o vaya a la sala de emergencias.  Nmeros de bper  - Dr. Hester: [phone]  - Dra. Jackquline: [phone]  - Dr. Claudene: [phone]   En caso de inclemencias del tiempo, por favor llame a landry capes principal al [phone] para una actualizacin sobre el [place] de cualquier retraso o cierre.  Consejos para la medicacin en  dermatologa: Por favor, guarde las cajas en las que vienen los medicamentos de uso tpico para ayudarle a seguir las instrucciones sobre dnde y cmo usarlos. Las farmacias generalmente imprimen las instrucciones del medicamento slo en las cajas y no directamente en los tubos del [place].   Si su medicamento es muy caro, por favor, pngase en contacto con landry rieger llamando al [phone] y presione la opcin 4 o envenos un mensaje a travs de Clinical cytogeneticist.   No podemos decirle cul ser su copago por los medicamentos por adelantado ya que esto es diferente dependiendo de la cobertura de su seguro. Sin embargo, es posible que podamos encontrar un medicamento sustituto a Audiological scientist un formulario para que el seguro cubra el medicamento que se considera necesario.   Si se requiere una autorizacin previa para que su compaa de seguros malta su medicamento, por favor permtanos de 1 a 2 das hbiles para completar este proceso.  Los precios de los medicamentos varan con frecuencia dependiendo del  lugar de dnde se surte la receta y alguna farmacias pueden ofrecer precios ms baratos.  El sitio web www.goodrx.com tiene cupones para medicamentos de Health and safety inspector. Los precios aqu no tienen en cuenta lo que podra costar con la ayuda del seguro (puede ser ms barato con su seguro), pero el sitio web puede darle el precio si no utiliz Tourist information centre manager.  - Puede imprimir el cupn correspondiente y llevarlo con su receta a la farmacia.  - Tambin puede pasar por nuestra oficina durante el horario de atencin regular y Education officer, museum una tarjeta de cupones de GoodRx.  - Si necesita que su receta se enve electrnicamente a una farmacia diferente, informe a nuestra oficina a travs de MyChart de [place] o por telfono llamando al [phone] y presione la opcin 4.

## 2023-07-31 ENCOUNTER — Ambulatory Visit: Payer: Self-pay | Admitting: Dermatology

## 2023-07-31 DIAGNOSIS — B351 Tinea unguium: Secondary | ICD-10-CM

## 2023-07-31 LAB — HEPATIC FUNCTION PANEL
ALT: 63 IU/L — ABNORMAL HIGH (ref 0–44)
AST: 44 IU/L — ABNORMAL HIGH (ref 0–40)
Albumin: 4.7 g/dL (ref 3.9–4.9)
Alkaline Phosphatase: 87 IU/L (ref 44–121)
Bilirubin Total: 0.8 mg/dL (ref 0.0–1.2)
Bilirubin, Direct: 0.28 mg/dL (ref 0.00–0.40)
Total Protein: 7 g/dL (ref 6.0–8.5)

## 2023-07-31 MED ORDER — FLUCONAZOLE 200 MG PO TABS: 200.0000 mg | ORAL_TABLET | Freq: Every day | ORAL | 0 refills | Status: DC

## 2023-07-31 NOTE — Telephone Encounter (Signed)
 Advised pt of lab results.  Advised we would send in Fluconazole  200mg  #22 0rf, pt aware he takes 1 po q week.lex

## 2023-07-31 NOTE — Telephone Encounter (Signed)
-----   Message from Rexene Rattler sent at 07/31/2023  8:41 AM EDT ----- LFTs slightly elevated but stable, may continue fluconazole  200 mg weekly - please call patient ----- Message ----- From: Rebecka Memos Lab Results In Sent: 07/31/2023   7:36 AM EDT To: Rexene Rattler, MD

## 2023-08-31 ENCOUNTER — Other Ambulatory Visit: Payer: Self-pay | Admitting: Dermatology

## 2023-08-31 DIAGNOSIS — B351 Tinea unguium: Secondary | ICD-10-CM

## 2023-09-03 ENCOUNTER — Encounter: Payer: Self-pay | Admitting: Family Medicine

## 2023-10-08 ENCOUNTER — Ambulatory Visit: Admitting: Family Medicine

## 2023-10-10 ENCOUNTER — Ambulatory Visit: Admitting: Family Medicine

## 2023-10-24 ENCOUNTER — Encounter: Payer: Self-pay | Admitting: Family Medicine

## 2023-10-24 ENCOUNTER — Ambulatory Visit (INDEPENDENT_AMBULATORY_CARE_PROVIDER_SITE_OTHER): Admitting: Family Medicine

## 2023-10-24 VITALS — BP 132/78 | HR 60 | Temp 98.0°F | Ht 73.5 in | Wt 264.0 lb

## 2023-10-24 DIAGNOSIS — E039 Hypothyroidism, unspecified: Secondary | ICD-10-CM

## 2023-10-24 DIAGNOSIS — E559 Vitamin D deficiency, unspecified: Secondary | ICD-10-CM | POA: Diagnosis not present

## 2023-10-24 DIAGNOSIS — I10 Essential (primary) hypertension: Secondary | ICD-10-CM

## 2023-10-24 DIAGNOSIS — Z1211 Encounter for screening for malignant neoplasm of colon: Secondary | ICD-10-CM

## 2023-10-24 MED ORDER — OMEPRAZOLE 20 MG PO CPDR: 20.0000 mg | DELAYED_RELEASE_CAPSULE | Freq: Every day | ORAL | 1 refills | Status: AC

## 2023-10-24 MED ORDER — LOSARTAN POTASSIUM 100 MG PO TABS: 100.0000 mg | ORAL_TABLET | Freq: Every day | ORAL | 1 refills | Status: AC

## 2023-10-24 MED ORDER — NAPROXEN 500 MG PO TABS: 500.0000 mg | ORAL_TABLET | Freq: Two times a day (BID) | ORAL | 1 refills | Status: AC

## 2023-10-24 NOTE — Assessment & Plan Note (Signed)
 Rechecking labs today. Await results. Treat as needed.

## 2023-10-24 NOTE — Assessment & Plan Note (Signed)
 Under good control on current regimen. Continue current regimen. Continue to monitor. Call with any concerns. Refills given. Labs drawn today.

## 2023-10-24 NOTE — Progress Notes (Signed)
 BP 132/78   Pulse 60   Temp 98 F (36.7 C) (Oral)   Ht 6' 1.5 (1.867 m)   Wt 264 lb (119.7 kg)   SpO2 98%   BMI 34.36 kg/m    Subjective:    Patient ID: Nicholas Wall, male    DOB: [birth year], 68 y.o.   MRN: [id number]  HPI: Nicholas Wall is a 68 y.o. male  Chief Complaint  Patient presents with   Hypertension   Hypothyroidism   HYPERTENSION  Hypertension status: controlled  Satisfied with current treatment? yes Duration of hypertension: chronic BP monitoring frequency:  not checking BP medication side effects:  no Medication compliance: excellent compliance Previous BP meds: losartan  Aspirin: no Recurrent headaches: no Visual changes: no Palpitations: no Dyspnea: no Chest pain: no Lower extremity edema: no Dizzy/lightheaded: no  HYPOTHYROIDISM Thyroid  control status:controlled Satisfied with current treatment? yes Medication side effects: no Medication compliance: excellent compliance Recent dose adjustment:no Fatigue: no Cold intolerance: no Heat intolerance: no Weight gain: no Weight loss: no Constipation: no Diarrhea/loose stools: no Palpitations: no Lower extremity edema: no Anxiety/depressed mood: no   Relevant past medical, surgical, family and social history reviewed and updated as indicated. Interim medical history since our last visit reviewed. Allergies and medications reviewed and updated.  Review of Systems  Constitutional: Negative.   Respiratory: Negative.    Cardiovascular: Negative.   Musculoskeletal: Negative.   Psychiatric/Behavioral: Negative.      Per HPI unless specifically indicated above     Objective:    BP 132/78   Pulse 60   Temp 98 F (36.7 C) (Oral)   Ht 6' 1.5 (1.867 m)   Wt 264 lb (119.7 kg)   SpO2 98%   BMI 34.36 kg/m   Wt Readings from Last 3 Encounters:  10/24/23 264 lb (119.7 kg)  04/04/23 269 lb 6.4 oz (122.2 kg)  10/04/22 269 lb 12.8 oz (122.4 kg)    Physical Exam Vitals and nursing note  reviewed.  Constitutional:      General: He is not in acute distress.    Appearance: Normal appearance. He is not ill-appearing, toxic-appearing or diaphoretic.  HENT:     Head: Normocephalic and atraumatic.     Right Ear: External ear normal.     Left Ear: External ear normal.     Nose: Nose normal.     Mouth/Throat:     Mouth: Mucous membranes are moist.     Pharynx: Oropharynx is clear.  Eyes:     General: No scleral icterus.       Right eye: No discharge.        Left eye: No discharge.     Extraocular Movements: Extraocular movements intact.     Conjunctiva/sclera: Conjunctivae normal.     Pupils: Pupils are equal, round, and reactive to light.  Cardiovascular:     Rate and Rhythm: Normal rate and regular rhythm.     Pulses: Normal pulses.     Heart sounds: Normal heart sounds. No murmur heard.    No friction rub. No gallop.  Pulmonary:     Effort: Pulmonary effort is normal. No respiratory distress.     Breath sounds: Normal breath sounds. No stridor. No wheezing, rhonchi or rales.  Chest:     Chest wall: No tenderness.  Musculoskeletal:        General: Normal range of motion.     Cervical back: Normal range of motion and neck supple.  Skin:    General: Skin is  warm and dry.     Capillary Refill: Capillary refill takes less than 2 seconds.     Coloration: Skin is not jaundiced or pale.     Findings: No bruising, erythema, lesion or rash.  Neurological:     General: No focal deficit present.     Mental Status: He is alert and oriented to person, place, and time. Mental status is at baseline.  Psychiatric:        Mood and Affect: Mood normal.        Behavior: Behavior normal.        Thought Content: Thought content normal.        Judgment: Judgment normal.     Results for orders placed or performed in visit on 07/30/23  Hepatic Function Panel   Collection Time: 07/30/23 10:03 AM  Result Value Ref Range   Total Protein 7.0 6.0 - 8.5 g/dL   Albumin 4.7 3.9 - 4.9  g/dL   Bilirubin Total 0.8 0.0 - 1.2 mg/dL   Bilirubin, Direct 9.71 0.00 - 0.40 mg/dL   Alkaline Phosphatase 87 44 - 121 IU/L   AST 44 (H) 0 - 40 IU/L   ALT 63 (H) 0 - 44 IU/L      Assessment & Plan:   Problem List Items Addressed This Visit       Cardiovascular and Mediastinum   Essential hypertension - Primary   Under good control on current regimen. Continue current regimen. Continue to monitor. Call with any concerns. Refills given. Labs drawn today.        Relevant Medications   losartan  (COZAAR ) 100 MG tablet   Other Relevant Orders   CBC with Differential/Platelet   Comprehensive metabolic panel with GFR     Endocrine   Hypothyroidism   Rechecking labs today. Await results. Treat as needed.       Relevant Orders   CBC with Differential/Platelet   TSH     Other   Vitamin D  deficiency   Rechecking labs today. Await results. Treat as needed.       Relevant Orders   CBC with Differential/Platelet   VITAMIN D  25 Hydroxy (Vit-D Deficiency, Fractures)   Other Visit Diagnoses       Screening for colon cancer       Due in January. Referral placed today   Relevant Orders   Ambulatory referral to Gastroenterology        Follow up plan: Return in about 6 months (around 04/23/2024) for physical.

## 2023-10-25 LAB — CBC WITH DIFFERENTIAL/PLATELET
Basophils Absolute: 0.1 x10E3/uL (ref 0.0–0.2)
Basos: 1 %
EOS (ABSOLUTE): 0.2 x10E3/uL (ref 0.0–0.4)
Eos: 3 %
Hematocrit: 43.6 % (ref 37.5–51.0)
Hemoglobin: 14.7 g/dL (ref 13.0–17.7)
Immature Grans (Abs): 0 x10E3/uL (ref 0.0–0.1)
Immature Granulocytes: 0 %
Lymphocytes Absolute: 2.3 x10E3/uL (ref 0.7–3.1)
Lymphs: 30 %
MCH: 33 pg (ref 26.6–33.0)
MCHC: 33.7 g/dL (ref 31.5–35.7)
MCV: 98 fL — ABNORMAL HIGH (ref 79–97)
Monocytes Absolute: 0.6 x10E3/uL (ref 0.1–0.9)
Monocytes: 8 %
Neutrophils Absolute: 4.4 x10E3/uL (ref 1.4–7.0)
Neutrophils: 58 %
Platelets: 216 x10E3/uL (ref 150–450)
RBC: 4.46 x10E6/uL (ref 4.14–5.80)
RDW: 13 % (ref 11.6–15.4)
WBC: 7.6 x10E3/uL (ref 3.4–10.8)

## 2023-10-25 LAB — COMPREHENSIVE METABOLIC PANEL WITH GFR
ALT: 63 IU/L — ABNORMAL HIGH (ref 0–44)
AST: 43 IU/L — ABNORMAL HIGH (ref 0–40)
Albumin: 4.4 g/dL (ref 3.9–4.9)
Alkaline Phosphatase: 86 IU/L (ref 47–123)
BUN/Creatinine Ratio: 17 (ref 10–24)
BUN: 16 mg/dL (ref 8–27)
Bilirubin Total: 0.6 mg/dL (ref 0.0–1.2)
CO2: 23 mmol/L (ref 20–29)
Calcium: 9.3 mg/dL (ref 8.6–10.2)
Chloride: 101 mmol/L (ref 96–106)
Creatinine, Ser: 0.92 mg/dL (ref 0.76–1.27)
Globulin, Total: 2.3 g/dL (ref 1.5–4.5)
Glucose: 172 mg/dL — ABNORMAL HIGH (ref 70–99)
Potassium: 4.4 mmol/L (ref 3.5–5.2)
Sodium: 139 mmol/L (ref 134–144)
Total Protein: 6.7 g/dL (ref 6.0–8.5)
eGFR: 92 mL/min/1.73 (ref >59)

## 2023-10-25 LAB — TSH: TSH: 4.5 u[IU]/mL (ref 0.450–4.500)

## 2023-10-25 LAB — VITAMIN D 25 HYDROXY (VIT D DEFICIENCY, FRACTURES): Vit D, 25-Hydroxy: 29.5 ng/mL — ABNORMAL LOW (ref 30.0–100.0)

## 2023-10-27 ENCOUNTER — Ambulatory Visit: Payer: Self-pay | Admitting: Family Medicine

## 2024-01-26 ENCOUNTER — Ambulatory Visit: Admitting: Dermatology

## 2024-03-01 ENCOUNTER — Ambulatory Visit: Payer: Managed Care, Other (non HMO) | Admitting: Dermatology

## 2024-04-30 ENCOUNTER — Encounter: Admitting: Family Medicine
